# Patient Record
Sex: Female | Born: 1960
Health system: Southern US, Community
[De-identification: ages and names within clinical notes are randomized; demographics above are authoritative.]

## PROBLEM LIST (undated history)

## (undated) DIAGNOSIS — F411 Generalized anxiety disorder: Secondary | ICD-10-CM

## (undated) DIAGNOSIS — R5381 Other malaise: Secondary | ICD-10-CM

## (undated) DIAGNOSIS — E039 Hypothyroidism, unspecified: Secondary | ICD-10-CM

## (undated) DIAGNOSIS — R5383 Other fatigue: Secondary | ICD-10-CM

## (undated) DIAGNOSIS — E0500 Thyrotoxicosis with diffuse goiter without thyrotoxic crisis or storm: Secondary | ICD-10-CM

## (undated) DIAGNOSIS — E0590 Thyrotoxicosis, unspecified without thyrotoxic crisis or storm: Secondary | ICD-10-CM

## (undated) DIAGNOSIS — Z978 Presence of other specified devices: Secondary | ICD-10-CM

## (undated) HISTORY — PX: AUGMENTATION MAMMAPLASTY: SUR837

## (undated) HISTORY — DX: Hypothyroidism, unspecified: E03.9

## (undated) HISTORY — DX: Generalized anxiety disorder: F41.1

## (undated) HISTORY — DX: Presence of other specified devices: Z97.8

## (undated) HISTORY — DX: Thyrotoxicosis with diffuse goiter without thyrotoxic crisis or storm: E05.00

## (undated) HISTORY — DX: Other fatigue: R53.83

## (undated) HISTORY — DX: Other malaise: R53.81

---

## 1997-11-30 ENCOUNTER — Other Ambulatory Visit: Admission: RE | Admit: 1997-11-30 | Discharge: 1997-11-30 | Payer: Self-pay | Admitting: Obstetrics and Gynecology

## 1998-01-29 ENCOUNTER — Other Ambulatory Visit: Admission: RE | Admit: 1998-01-29 | Discharge: 1998-01-29 | Payer: Self-pay | Admitting: Obstetrics and Gynecology

## 1998-04-28 ENCOUNTER — Other Ambulatory Visit: Admission: RE | Admit: 1998-04-28 | Discharge: 1998-04-28 | Payer: Self-pay | Admitting: Gynecology

## 1999-07-11 ENCOUNTER — Other Ambulatory Visit: Admission: RE | Admit: 1999-07-11 | Discharge: 1999-07-11 | Payer: Self-pay | Admitting: Obstetrics and Gynecology

## 2000-03-15 ENCOUNTER — Encounter: Payer: Self-pay | Admitting: Obstetrics and Gynecology

## 2000-03-15 ENCOUNTER — Encounter: Admission: RE | Admit: 2000-03-15 | Discharge: 2000-03-15 | Payer: Self-pay | Admitting: Otolaryngology

## 2000-07-23 ENCOUNTER — Other Ambulatory Visit: Admission: RE | Admit: 2000-07-23 | Discharge: 2000-07-23 | Payer: Self-pay | Admitting: Obstetrics and Gynecology

## 2001-07-28 ENCOUNTER — Other Ambulatory Visit: Admission: RE | Admit: 2001-07-28 | Discharge: 2001-07-28 | Payer: Self-pay | Admitting: Obstetrics and Gynecology

## 2001-11-18 ENCOUNTER — Encounter: Payer: Self-pay | Admitting: Obstetrics and Gynecology

## 2001-11-18 ENCOUNTER — Encounter: Admission: RE | Admit: 2001-11-18 | Discharge: 2001-11-18 | Payer: Self-pay | Admitting: Obstetrics and Gynecology

## 2002-08-28 ENCOUNTER — Other Ambulatory Visit: Admission: RE | Admit: 2002-08-28 | Discharge: 2002-08-28 | Payer: Self-pay | Admitting: Obstetrics and Gynecology

## 2003-01-18 ENCOUNTER — Encounter: Admission: RE | Admit: 2003-01-18 | Discharge: 2003-01-18 | Payer: Self-pay | Admitting: Obstetrics and Gynecology

## 2003-08-31 ENCOUNTER — Other Ambulatory Visit: Admission: RE | Admit: 2003-08-31 | Discharge: 2003-08-31 | Payer: Self-pay | Admitting: Obstetrics and Gynecology

## 2004-09-03 ENCOUNTER — Other Ambulatory Visit: Admission: RE | Admit: 2004-09-03 | Discharge: 2004-09-03 | Payer: Self-pay | Admitting: Obstetrics and Gynecology

## 2004-09-25 ENCOUNTER — Encounter: Admission: RE | Admit: 2004-09-25 | Discharge: 2004-09-25 | Payer: Self-pay | Admitting: Obstetrics and Gynecology

## 2005-03-01 ENCOUNTER — Emergency Department: Payer: Self-pay | Admitting: General Practice

## 2005-09-11 ENCOUNTER — Other Ambulatory Visit: Admission: RE | Admit: 2005-09-11 | Discharge: 2005-09-11 | Payer: Self-pay | Admitting: Obstetrics and Gynecology

## 2005-11-19 LAB — CONVERTED CEMR LAB: Pap Smear: NORMAL

## 2006-06-02 ENCOUNTER — Emergency Department: Payer: Self-pay | Admitting: Emergency Medicine

## 2006-07-02 ENCOUNTER — Encounter: Payer: Self-pay | Admitting: Internal Medicine

## 2006-07-27 ENCOUNTER — Ambulatory Visit: Payer: Self-pay | Admitting: Internal Medicine

## 2006-07-27 DIAGNOSIS — R5381 Other malaise: Secondary | ICD-10-CM

## 2006-07-27 DIAGNOSIS — R079 Chest pain, unspecified: Secondary | ICD-10-CM

## 2006-07-27 DIAGNOSIS — R519 Headache, unspecified: Secondary | ICD-10-CM | POA: Insufficient documentation

## 2006-07-27 DIAGNOSIS — R51 Headache: Secondary | ICD-10-CM

## 2006-07-27 DIAGNOSIS — Z978 Presence of other specified devices: Secondary | ICD-10-CM

## 2006-07-27 DIAGNOSIS — R5383 Other fatigue: Secondary | ICD-10-CM

## 2006-07-27 HISTORY — DX: Other malaise: R53.81

## 2006-07-27 HISTORY — DX: Presence of other specified devices: Z97.8

## 2006-07-27 LAB — CONVERTED CEMR LAB
Bilirubin Urine: NEGATIVE
Blood Glucose, Fingerstick: 87
Blood in Urine, dipstick: NEGATIVE
Glucose, Urine, Semiquant: NEGATIVE
Ketones, urine, test strip: NEGATIVE
Nitrite: NEGATIVE
Protein, U semiquant: NEGATIVE
Specific Gravity, Urine: <1.005
Urobilinogen, UA: NEGATIVE
WBC Urine, dipstick: NEGATIVE
pH: 5

## 2006-07-30 ENCOUNTER — Ambulatory Visit: Payer: Self-pay | Admitting: Cardiology

## 2006-08-02 ENCOUNTER — Encounter (INDEPENDENT_AMBULATORY_CARE_PROVIDER_SITE_OTHER): Payer: Self-pay | Admitting: *Deleted

## 2006-08-02 LAB — CONVERTED CEMR LAB
Albumin: 3.9 g/dL (ref 3.5–5.2)
Alkaline Phosphatase: 43 units/L (ref 39–117)
BUN: 11 mg/dL (ref 6–23)
Creatinine, Ser: 0.8 mg/dL (ref 0.4–1.2)
GFR calc non Af Amer: 83 mL/min
Potassium: 4.4 meq/L (ref 3.5–5.1)
Sed Rate: 8 mm/hr (ref 0–25)
Sodium: 137 meq/L (ref 135–145)
Total Bilirubin: 0.7 mg/dL (ref 0.3–1.2)

## 2006-08-09 ENCOUNTER — Ambulatory Visit: Payer: Self-pay | Admitting: Cardiovascular Disease

## 2006-08-13 ENCOUNTER — Ambulatory Visit: Payer: Self-pay | Admitting: Family Medicine

## 2006-08-13 DIAGNOSIS — F329 Major depressive disorder, single episode, unspecified: Secondary | ICD-10-CM

## 2006-08-17 ENCOUNTER — Ambulatory Visit: Payer: Self-pay

## 2006-09-13 ENCOUNTER — Telehealth (INDEPENDENT_AMBULATORY_CARE_PROVIDER_SITE_OTHER): Payer: Self-pay | Admitting: *Deleted

## 2006-09-17 ENCOUNTER — Ambulatory Visit: Payer: Self-pay | Admitting: Internal Medicine

## 2006-09-17 ENCOUNTER — Other Ambulatory Visit: Admission: RE | Admit: 2006-09-17 | Discharge: 2006-09-17 | Payer: Self-pay | Admitting: Obstetrics and Gynecology

## 2006-09-17 DIAGNOSIS — F411 Generalized anxiety disorder: Secondary | ICD-10-CM | POA: Insufficient documentation

## 2006-09-17 HISTORY — DX: Generalized anxiety disorder: F41.1

## 2006-09-21 LAB — CONVERTED CEMR LAB
Eosinophils Absolute: 0.2 10*3/uL (ref 0.0–0.7)
Eosinophils Relative: 3 % (ref 0–5)
Folate: >20 ng/mL
Glucose, Bld: 87 mg/dL (ref 70–99)
HCT: 40.4 % (ref 36.0–46.0)
Hemoglobin: 13.5 g/dL (ref 12.0–15.0)
Lymphocytes Relative: 33 % (ref 12–46)
Lymphs Abs: 2.4 10*3/uL (ref 0.7–3.3)
MCV: 96.2 fL (ref 78.0–100.0)
Monocytes Absolute: 0.5 10*3/uL (ref 0.2–0.7)
Monocytes Relative: 7 % (ref 3–11)
Platelets: 259 10*3/uL (ref 150–400)
RBC: 4.2 M/uL (ref 3.87–5.11)
WBC: 7.3 10*3/uL (ref 4.0–10.5)

## 2006-10-25 ENCOUNTER — Telehealth (INDEPENDENT_AMBULATORY_CARE_PROVIDER_SITE_OTHER): Payer: Self-pay | Admitting: *Deleted

## 2007-11-25 IMAGING — CT CT HEAD W/O CM
1 series · 16 of 30 positions shown, 20 images · IV contrast (agent unspecified)
Comparison: none

CLINICAL DATA: Frontal headaches on a daily basis.  Fatigue.  Lower extremity weakness.  Dizziness.
 HEAD CT WITHOUT CONTRAST:
TECHNIQUE: Contiguous axial images were obtained from the base of the skull through the vertex according to standard protocol without contrast.

[Series 2: head_seq 4.5 h37s st · axial · 0.43mm/px · z∈[-126,+0]mm · 16 of 32 slices shown, 20 images]
[im 2/32  brain]
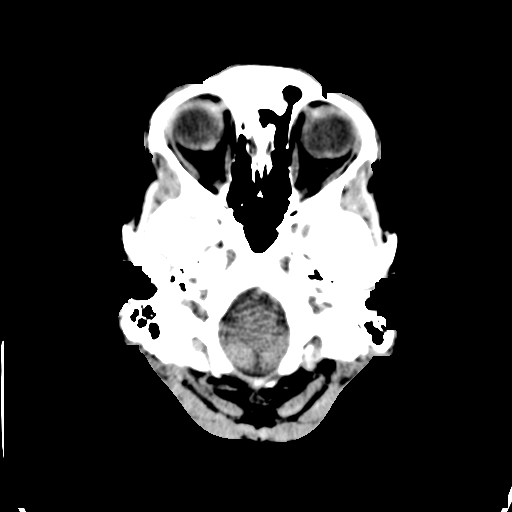
[im 2/32  bone]
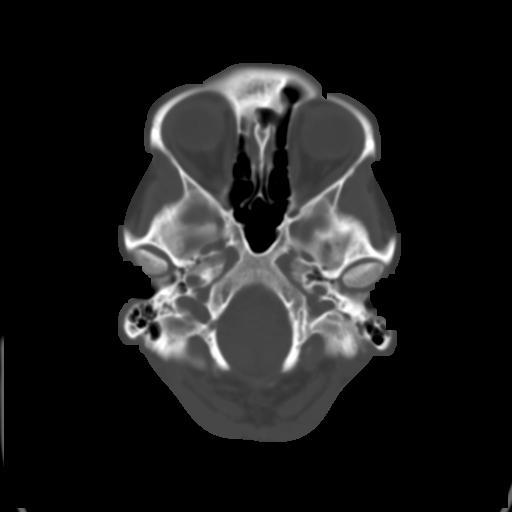
[im 4/32  brain]
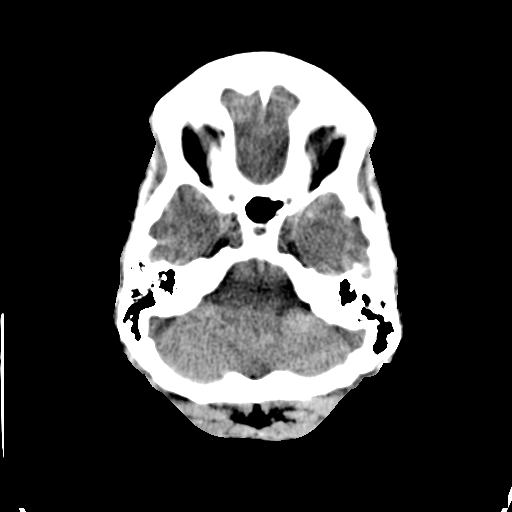
[im 6/32  brain]
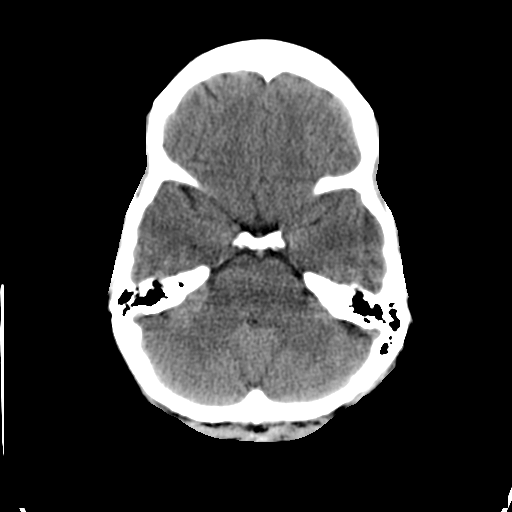
[im 8/32  brain]
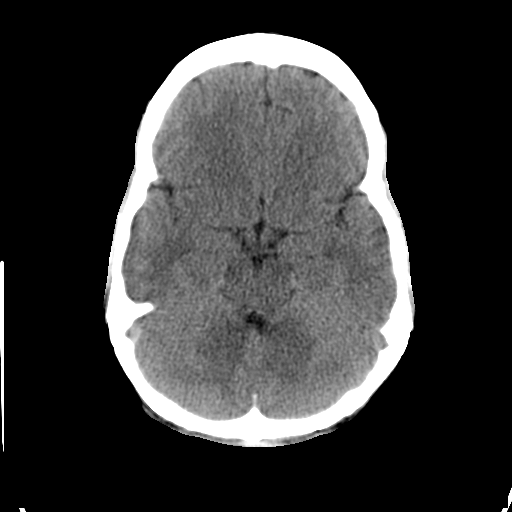
[im 9/32  brain]
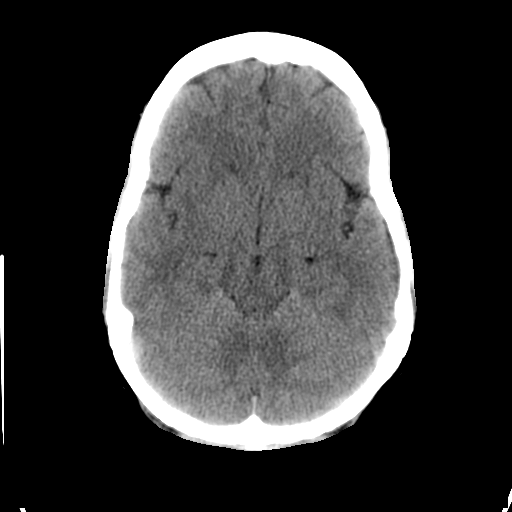
[im 9/32  bone]
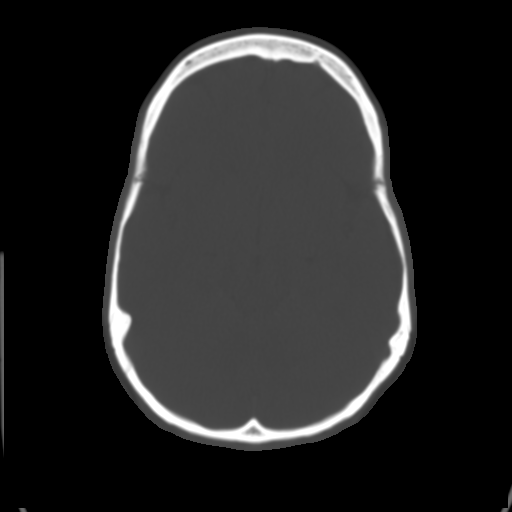
[im 11/32  brain]
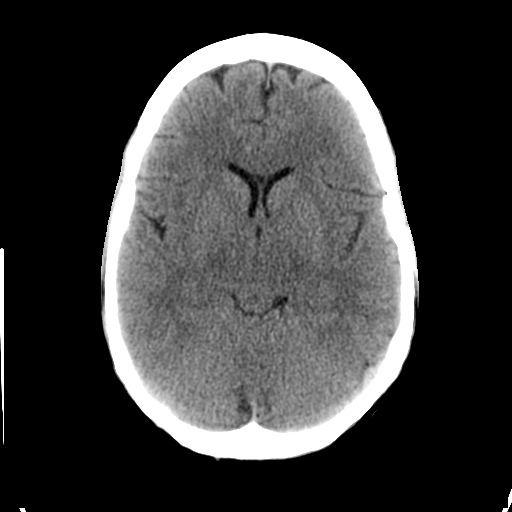
[im 13/32  brain]
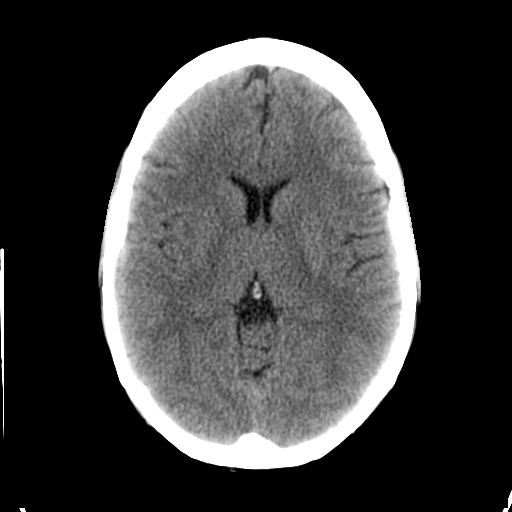
[im 15/32  brain]
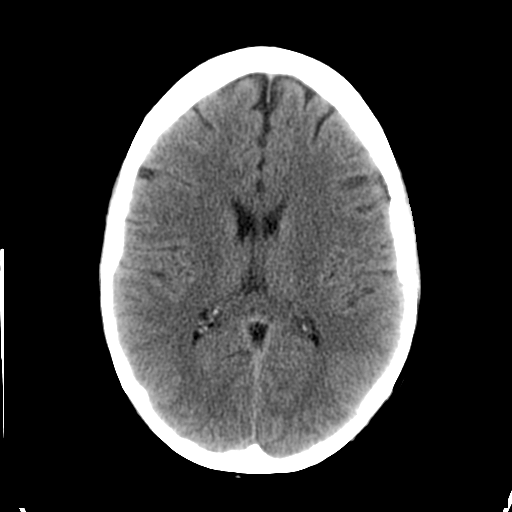
[im 17/32  brain]
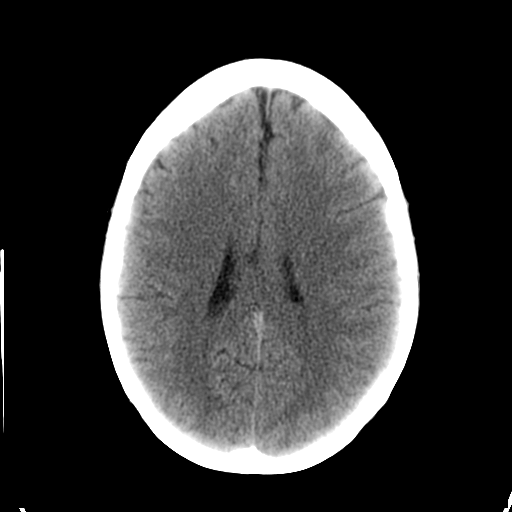
[im 17/32  bone]
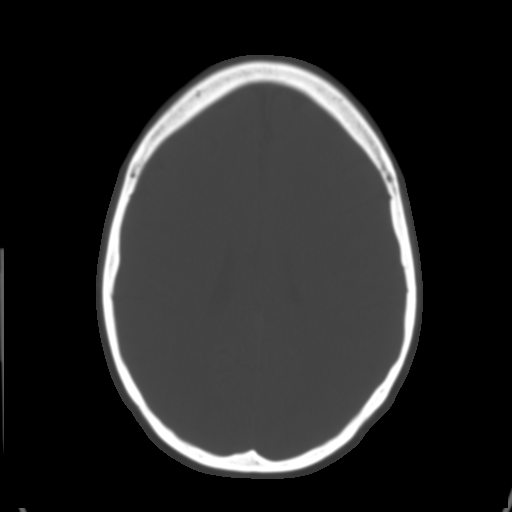
[im 19/32  brain]
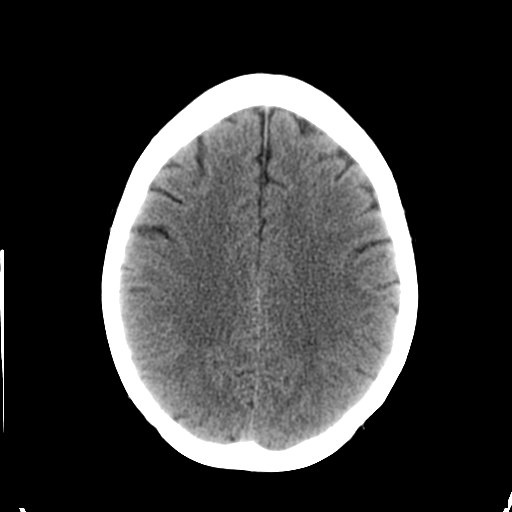
[im 21/32  brain]
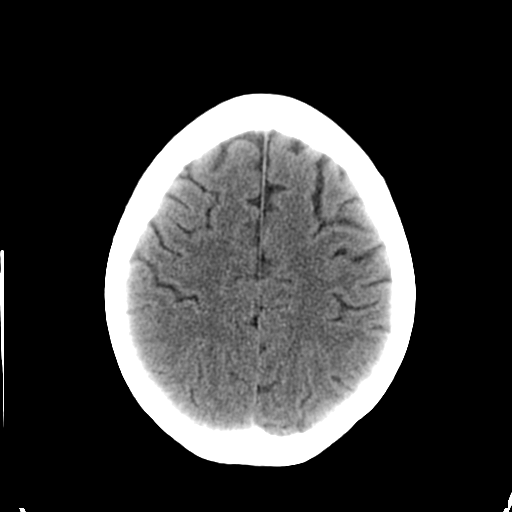
[im 23/32  brain]
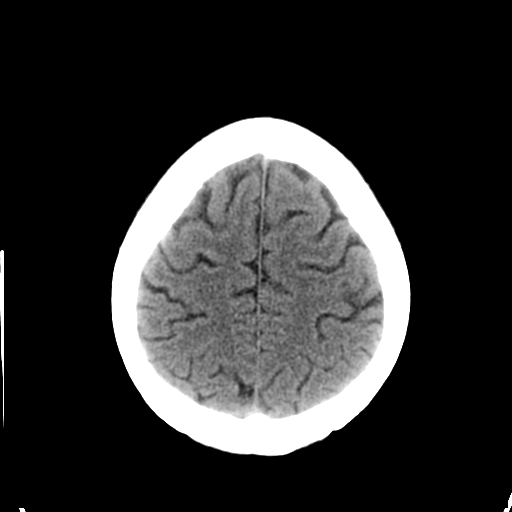
[im 24/32  brain]
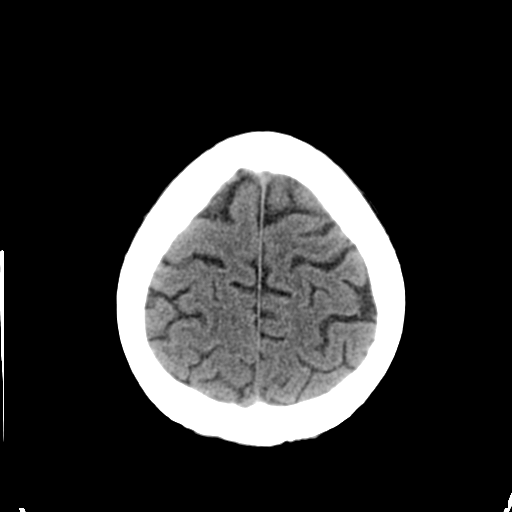
[im 24/32  bone]
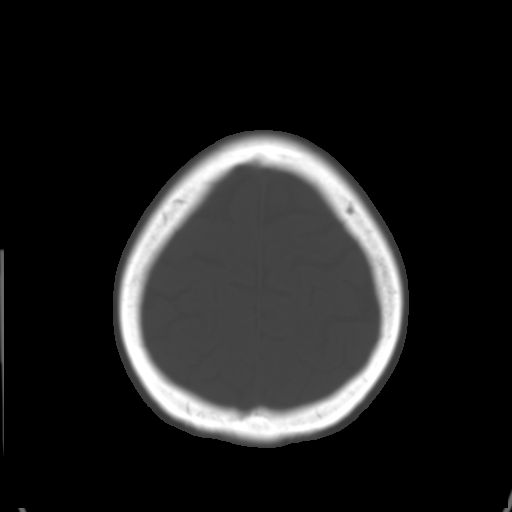
[im 26/32  brain]
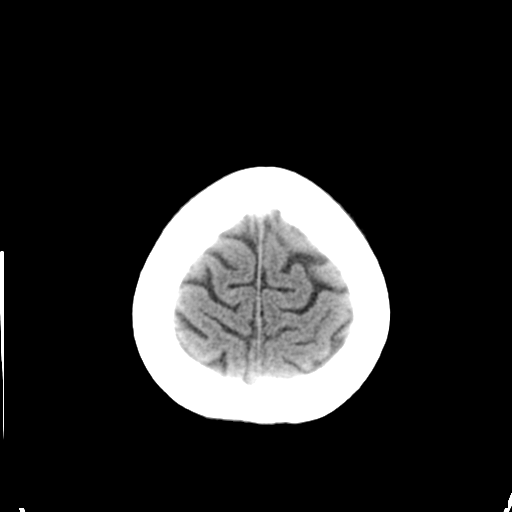
[im 28/32  brain]
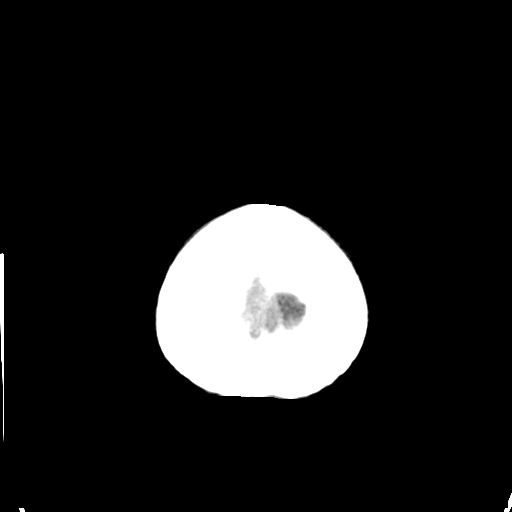
[im 30/32  brain]
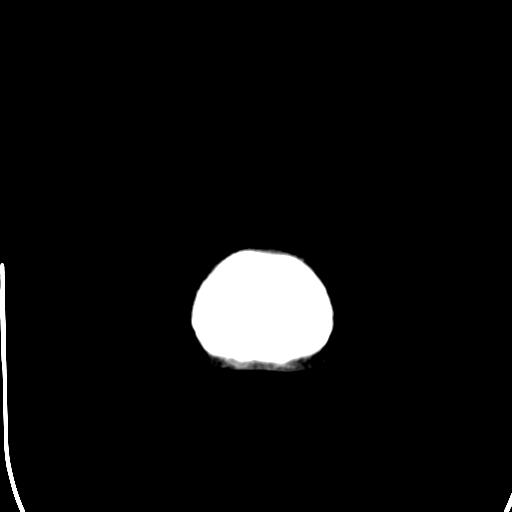

[16 of 30 positions shown; findings below may reference images not displayed]

FINDINGS: Brain has a normal appearance without evidence of atrophy, stroke, mass, hemorrhage, hydrocephalus or extraaxial collection.  Calvarium is normal.  The paranasal sinuses, middle ears and mastoids are clear.
IMPRESSION: Normal examination.

## 2007-12-08 ENCOUNTER — Ambulatory Visit: Payer: Self-pay

## 2007-12-21 ENCOUNTER — Ambulatory Visit: Payer: Self-pay

## 2008-06-21 ENCOUNTER — Ambulatory Visit: Payer: Self-pay | Admitting: Gastroenterology

## 2008-07-12 ENCOUNTER — Ambulatory Visit: Payer: Self-pay | Admitting: Specialist

## 2009-03-18 ENCOUNTER — Ambulatory Visit: Payer: Self-pay

## 2009-03-19 ENCOUNTER — Encounter: Payer: Self-pay | Admitting: Endocrinology

## 2009-03-19 LAB — CONVERTED CEMR LAB: Pap Smear: NORMAL

## 2009-03-19 LAB — HM MAMMOGRAPHY: HM Mammogram: NORMAL

## 2010-02-13 ENCOUNTER — Encounter: Payer: Self-pay | Admitting: Endocrinology

## 2010-02-13 LAB — CONVERTED CEMR LAB
ALT: 26 units/L
AST: 24 units/L
Albumin: 4.5 g/dL
Alkaline Phosphatase: 56 units/L
Basophils Relative: 0 %
CO2: 21 meq/L
Calcium: 9.7 mg/dL
Creatinine, Ser: 0.75 mg/dL
Eosinophils Relative: 0.2 %
Folate: >19.9 ng/mL
Glucose, Bld: 85 mg/dL
Hemoglobin: 13.5 g/dL
Monocytes Relative: 0.7 %
Neutrophils Relative %: 5.2 %
Platelets: 325 10*3/uL
RBC: 4.18 M/uL
Sodium: 139 meq/L
TSH: 0.077 microintl units/mL
Total Bilirubin: 0.1 mg/dL
Vit D, 25-Hydroxy: 51.2 ng/mL
Vitamin B-12: 1130 pg/mL
WBC: 8.4 10*3/uL

## 2010-03-06 ENCOUNTER — Encounter: Payer: Self-pay | Admitting: Endocrinology

## 2010-03-13 ENCOUNTER — Encounter: Payer: Self-pay | Admitting: Endocrinology

## 2010-03-13 ENCOUNTER — Ambulatory Visit (INDEPENDENT_AMBULATORY_CARE_PROVIDER_SITE_OTHER): Payer: 59 | Admitting: Endocrinology

## 2010-03-13 DIAGNOSIS — E0590 Thyrotoxicosis, unspecified without thyrotoxic crisis or storm: Secondary | ICD-10-CM | POA: Insufficient documentation

## 2010-03-13 DIAGNOSIS — E0500 Thyrotoxicosis with diffuse goiter without thyrotoxic crisis or storm: Secondary | ICD-10-CM | POA: Insufficient documentation

## 2010-03-13 HISTORY — DX: Thyrotoxicosis with diffuse goiter without thyrotoxic crisis or storm: E05.00

## 2010-03-14 ENCOUNTER — Other Ambulatory Visit: Payer: Self-pay | Admitting: Endocrinology

## 2010-03-14 DIAGNOSIS — E0590 Thyrotoxicosis, unspecified without thyrotoxic crisis or storm: Secondary | ICD-10-CM

## 2010-03-18 NOTE — Assessment & Plan Note (Signed)
Summary: NEW ENDO CON/ LOW TSH/ [facility]/ DR Tomi Bamberger / [phone] Natale Milch  #   Vital Signs:  Patient profile:   50 year old female Height:      65.75 inches (167.01 cm) Weight:      161.50 pounds (73.41 kg) BMI:     26.36 O2 Sat:      97 % on Room air Temp:     98.6 degrees F (37.00 degrees C) oral Pulse rate:   78 / minute Pulse rhythm:   regular BP sitting:   112 / 76  (left arm) Cuff size:   regular  Vitals Entered By: Brenton Grills CMA Duncan Dull) (March 13, 2010 1:22 PM)  O2 Flow:  Room air CC: New Endo Consult/Low TSH/Dr. Darl Pikes Fuller/aj Is Patient Diabetic? No   Referring Provider:  Tomi Bamberger MD  CC:  New Endo Consult/Low TSH/Dr. Darl Pikes Fuller/aj.  History of Present Illness: pt says she took synthroid x 5 years as a teenager, but was then able to go off it.  she now reports 6-12 months of moderate myalgias, worst at the legs.  she has assoc fatigue.  Current Medications (verified): 1)  Bcp 2)  Mvi 3)  Cal,mg,b12 4)  Celexa 40 Mg Tabs (Citalopram Hydrobromide) .Marland Kitchen.. 1 Tablet By Mouth Once Daily 5)  Wellbutrin Xl 300 Mg Xr24h-Tab (Bupropion Hcl) .Marland Kitchen.. 1 Tablet By Mouth Every Morning  Allergies (verified): 1)  ! Codeine  Past History:  Past Medical History: Last updated: 09/17/2006 Hypothyroidism - was treated when she was [age] yo G1 P1 Anxiety Depression  Family History: Reviewed history from 07/27/2006 and no changes required. Father: deceased  MI age 62yo - prostate Ca, Lung Cancer Mother: living - sarcoma Siblings: twin - good health bro - good health breast Ca - M. family Family History of Alcoholism/Addiction (Mother) Family History High cholesterol (Father) mother takes synthroid  Social History: Reviewed history from 07/27/2006 and no changes required. divorced--engaged to remarry one child  has a full time job Research scientist (physical sciences)), describes herself as "hyper" Patient has never smoked.  Regular Exercise - yes Does Patient Exercise:   yes  Review of Systems       The patient complains of weight gain and headaches.  The patient denies fever.         denies hoarseness, double vision, sob, diarrhea, polyuria, arthralgias, excessive diaphoresis, numbness, and rhinorrhea.   she has palpitations, tremor, easy bruising, and anxiety.  she has reg menses.   Physical Exam  General:  normal appearance.   Head:  head: no deformity eyes: no periorbital swelling, no proptosis external nose and ears are normal mouth: no lesion seen  Neck:  i do not appreciate a goiter Lungs:  Clear to auscultation bilaterally. Normal respiratory effort.  Heart:  Regular rate and rhythm without murmurs or gallops noted. Normal S1,S2.   Abdomen:  abdomen is soft, nontender.  no hepatosplenomegaly.   not distended.  no hernia  Msk:  muscle bulk and strength are grossly normal.  no obvious joint swelling.  gait is normal and steady  Pulses:  dorsalis pedis intact bilat.   Extremities:  no deformity.   no edema  Neurologic:  cn 2-12 grossly intact.   readily moves all 4's.   sensation is intact to touch on the feet there is a slight tremor of the hands Skin:  normal texture and temp.  no rash.  not diaphoretic  Cervical Nodes:  No significant adenopathy.  Psych:  Alert and cooperative; normal  mood and affect; normal attention span and concentration.   Additional Exam:  outside test results are reviewed:  TSH: 0.077   Impression & Recommendations:  Problem # 1:  HYPERTHYROIDISM (ICD-242.90) we discussed the causes, risks, and treatment options of hyperthyroidism  Problem # 2:  GRAVES' DISEASE (ICD-242.00) this is the most likely cause of #1, especially in view of the episode od thyroid dysfunction many years ago.  Problem # 3:  myalgias possibly due to #1  Medications Added to Medication List This Visit: 1)  Celexa 40 Mg Tabs (Citalopram hydrobromide) .Marland Kitchen.. 1 tablet by mouth once daily  Other Orders: i-131 Thyroid uptake and scan  (Capsule & Scan) Nuc Med Diagnostic (thyroid uptake & sca) Consultation Level IV (16109)  Patient Instructions: 1)  check thyroid "scan" (a special but easy and painless type of thyroid x-ray).  you will be called with a day and time for an appointment 2)  then please call [phone] to hear your test results. 3)  based on the result, i hope to request for you a radioactive iodine pill to treat the thyroid. 4)  return here approx 1 month later.     Orders Added: 1)  i-131 Thyroid uptake and scan (Capsule & Scan) Nuc Med Diagnostic [thyroid uptake & sca] 2)  Consultation Level IV [81191]     Preventive Care Screening  Mammogram:    Date:  03/19/2009    Results:  normal   Pap Smear:    Date:  03/19/2009    Results:  normal

## 2010-03-26 ENCOUNTER — Encounter (HOSPITAL_COMMUNITY)
Admission: RE | Admit: 2010-03-26 | Discharge: 2010-03-26 | Disposition: A | Discharge status: Home / Self Care | Payer: 59 | Source: Ambulatory Visit | Attending: Endocrinology | Admitting: Endocrinology

## 2010-03-26 DIAGNOSIS — E0590 Thyrotoxicosis, unspecified without thyrotoxic crisis or storm: Secondary | ICD-10-CM | POA: Insufficient documentation

## 2010-03-27 ENCOUNTER — Encounter (HOSPITAL_COMMUNITY): Payer: Self-pay

## 2010-03-27 ENCOUNTER — Ambulatory Visit (HOSPITAL_COMMUNITY)
Admission: RE | Admit: 2010-03-27 | Discharge: 2010-03-27 | Disposition: A | Discharge status: Home / Self Care | Payer: 59 | Source: Ambulatory Visit | Attending: Endocrinology | Admitting: Endocrinology

## 2010-03-27 DIAGNOSIS — E0590 Thyrotoxicosis, unspecified without thyrotoxic crisis or storm: Secondary | ICD-10-CM | POA: Insufficient documentation

## 2010-03-27 HISTORY — DX: Thyrotoxicosis, unspecified without thyrotoxic crisis or storm: E05.90

## 2010-03-27 MED ORDER — SODIUM IODIDE I 131 CAPSULE: 8.9000 | Freq: Once | INTRAVENOUS | Status: AC | PRN

## 2010-03-27 MED ORDER — SODIUM PERTECHNETATE TC 99M INJECTION
9.5000 | Freq: Once | INTRAVENOUS | Status: AC | PRN
  Administered 2010-03-27: 9.5 via INTRAVENOUS

## 2010-03-27 NOTE — Letter (Signed)
Summary: [facility]   Imported By: Lester Grant 03/17/2010 08:12:55  _____________________________________________________________________  External Attachment:    Type:   Image     Comment:   External Document

## 2010-04-11 ENCOUNTER — Encounter: Payer: Self-pay | Admitting: Endocrinology

## 2010-04-11 ENCOUNTER — Other Ambulatory Visit (INDEPENDENT_AMBULATORY_CARE_PROVIDER_SITE_OTHER): Payer: 59

## 2010-04-11 ENCOUNTER — Ambulatory Visit (INDEPENDENT_AMBULATORY_CARE_PROVIDER_SITE_OTHER): Payer: 59 | Admitting: Endocrinology

## 2010-04-11 VITALS — BP 106/74 | HR 81 | Temp 98.6°F | Ht 65.5 in | Wt 154.6 lb

## 2010-04-11 DIAGNOSIS — E0590 Thyrotoxicosis, unspecified without thyrotoxic crisis or storm: Secondary | ICD-10-CM

## 2010-04-11 LAB — T4, FREE: Free T4: 0.76 ng/dL (ref 0.60–1.60)

## 2010-04-11 LAB — TSH: TSH: 0.65 u[IU]/mL (ref 0.35–5.50)

## 2010-04-11 NOTE — Patient Instructions (Addendum)
blood tests are being ordered for you today.  please call [phone] to hear your test results. Please hold-off on the iodine supplement.   i have entered your pharmacy as [facility] at [address], [place].  Please call if this is incorrect (update: i left message on phone-tree:  Ret 1 month.  No rx is needed now)

## 2010-04-11 NOTE — Progress Notes (Signed)
  Subjective:    Patient ID: Debra Quinn, female    DOB: [birth year], 50 y.o.   MRN: [id number]  HPI Pt was seen here last month for hyperthyroidism.  She reports 6 mos of severe fatigue.  She brings a bottle of an iodine supplement which she is taking.  However, she says she has taken very little of this in the past month.   Past Medical History  Diagnosis Date  . Hyperthyroidism   . Hypothyroidism     was treated when she was [age] yo  . ANXIETY 09/17/2006  . GRAVES' DISEASE 03/13/2010  . FATIGUE, CHRONIC 07/27/2006  . BREAST IMPLANTS, BILATERAL, HX OF 07/27/2006   No past surgical history on file.  reports that she has never smoked. She does not have any smokeless tobacco history on file. Her alcohol and drug histories not on file. family history includes Alcohol abuse in her mother; Cancer in her father and other; and Hyperlipidemia in her father. Allergies  Allergen Reactions  . Codeine     REACTION: hives    Review of Systems Denies significant weight change.     Objective:   Physical Exam Gen:  no distress Neck:  No thyromegaly Skin:  Not diaphoertic     We reviewed thyroid scan, and i gave her a copy of the report.   Lab Results  Component Value Date   TSH 0.65 04/11/2010    Assessment & Plan:  Hyperthyroidism, resolved.  This may indicate resolving subacute thyroiditis.

## 2010-04-16 ENCOUNTER — Telehealth: Payer: Self-pay

## 2010-04-16 NOTE — Telephone Encounter (Signed)
Pt called stating that she is still severely fatigued and per PT message, does not feel that she can wait any longer for treatment. Pt is requesting a call from MD, please advise.

## 2010-04-17 ENCOUNTER — Telehealth: Payer: Self-pay | Admitting: Endocrinology

## 2010-04-17 NOTE — Telephone Encounter (Signed)
Pt states that she missed your call and request a callback on her mobile phone [[phone]].

## 2010-04-18 ENCOUNTER — Other Ambulatory Visit (INDEPENDENT_AMBULATORY_CARE_PROVIDER_SITE_OTHER): Payer: 59

## 2010-04-18 DIAGNOSIS — E0590 Thyrotoxicosis, unspecified without thyrotoxic crisis or storm: Secondary | ICD-10-CM

## 2010-04-18 DIAGNOSIS — E039 Hypothyroidism, unspecified: Secondary | ICD-10-CM

## 2010-04-18 LAB — T4, FREE: Free T4: 0.7 ng/dL (ref 0.60–1.60)

## 2010-04-18 NOTE — Progress Notes (Signed)
  Subjective:    Patient ID: Debra Quinn, female    DOB: Apr 20, 1960, 50 y.o.   MRN: [id number]  HPI i called pt back 04/17/10.  Come in for tsh and free t4 tomorrow.    Review of Systems     Objective:   Physical Exam        Assessment & Plan:

## 2010-04-18 NOTE — Progress Notes (Signed)
Addended by: Romero Belling on: 04/18/2010 09:55 AM   Modules accepted: Orders

## 2010-06-03 NOTE — Assessment & Plan Note (Signed)
[facility]                            CARDIOLOGY OFFICE NOTE   Debra Quinn, Debra Quinn                        MRN:          [id number]  DATE:08/09/2006                            DOB:          [birth year]    HISTORY OF PRESENT ILLNESS:  Debra Quinn is a 50 year old patient  referred by Dr. Drue Novel.  He was asking our opinion in regards to the  patient's chest pain, shortness of breath and fatigue.  The patient  clearly has some anxiety and depression.  She appears very stressed just  to talk to her.  She has been feeling ill over the last 3-6 months,  essentially since the holidays.  Apparently, she felt under a lot of  stress during the holidays and was started on Zoloft.  She asked Dr.  Sylvester Harder, her OB/GYN doctor to prescribe this.  She subsequently  has had some difficulty sleeping and has been taking Ambien.   She describes fluctuating weights. She said she lost weight during the  holidays and now has gained about 20 pounds since that time.  She is  extremely fatigued.  She feels that she has no energy.  She also is a  person who previously has liked to job.  She feels that she cannot get  the strength to exercise, so she gets a significant shortness of breath.  In talking to the patient, she is a nonsmoker.  There is no history of  emphysema or asthma.  There has been no cough or sputum production.  She  does not have any PND or orthopnea or lower extremity edema.  There has  been no previous cardiac history.  No history of mitral valve prolapse  or valve disease.  No history of cardiomyopathy.  She really has not had  a previous history of chest pain.  Her tightness is atypical.  It sounds  stress induced.  It can be nonexertional.  There is no significant  radiation.  She does not appear to have hyperventilation of her  lightheadedness or paresthesias with it.  In general, her sensation of  chest tightness is associated with her shortness of  breath, and  sometimes, they occur separately.   She denies any significant palpitations or flip flops of her heart, and  there has been no syncope.   REVIEW OF SYSTEMS:  Otherwise, remarkable for headaches.  They seem to  be increasing in nature.  There is occasional nausea with them.  Otherwise, negative.   CURRENT MEDICATIONS:  1. Zoloft 100 mg daily.  2. Ambien at night.  3. Birth control pills .   HABITS:  The patient's risk factors for coronary disease include no  tobacco use, no drug use, no alcohol use except for a couple of beers on  Saturday night.  There is a question of elevated fasting glucoses.  The patient is  somewhat concerned about this.  She said her fasting glucose Saturday at  10:00 was 141, and Sunday was 96.   PAST MEDICAL HISTORY:  Breast augmentation surgery.  Otherwise, she has  one child and her  surgical history is negative.   MEDICATIONS:  As listed.   SOCIAL HISTORY:  The patient sells insurance products.  She is divorced.  She likes to jog.  She appears to have some anxiety and depression.  There has been no major psychological problems in the past, but she  describes her life as extremely stressed.   LABORATORY DATA:  I reviewed lab work that was done by Dr. Drue Novel.  Her  fasting sugar at that time was normal.  Her TSH and T4 were normal.  There is question of history of previous hypothyroidism, currently not  on drugs.   FAMILY HISTORY:  Her family history is remarkable for father dying of an  MI at age 70, and he also had prostate cancer.  Mother is living and had  sarcoma.  She has a twin who is in good health and a brother who is in  good health.   ALLERGIES:  CODEINE.   PHYSICAL EXAMINATION:  GENERAL:  A somewhat stressed and agitated white  female, affect does appear anxious.  VITAL SIGNS:  Blood pressure 120/70, pulse 80 and regular.  She is  afebrile.  Weight is 140 pounds.  O2 saturations are 96% on room air.  HEENT:  No  thyromegaly.  No lymphadenopathy.  No JVP elevation.  LUNGS:  Clear with good diaphragmatic motion.  No wheezing.  HEART:  There is an S1, S2 with normal heart sounds.  PMI is normal.  BREAST:  She is status post breast augmentation surgery.  ABDOMEN:  Bowel sounds are positive.  No tenderness.  No  hepatosplenomegaly, hepatojugular reflux.  No AAA.  EXTREMITIES:  Distal pulses are intact with no edema.  NEUROLOGICAL:  Nonfocal.  There is no muscular weakness.  Baseline EKG  is normal.   IMPRESSION:  1. Dyspnea sounds functional, related to anxiety.  No evidence of      cardiopulmonary disease.  Check 2D echocardiogram to assess RV and      LV function.  2. Chest pain.  I do not think the patient needs a stress test at this      time.  As long as her LV function is normal by echocardiogram, she      will continue to follow up with Dr. Drue Novel.  The pain is atypical and      nonexertional.  We certainly could do routine treadmill tests in      the future if needed.  3. Anxiety and depression.  Continue Zoloft and Ambien at night.  I      suspect this is her biggest problem and it may be worthwhile to      refer her to Dr. Dellia Cloud.  4. Question elevated resting sugars.  I think it would be reasonable      for the patient to have hemoglobin A1C and possibly glucose      tolerance test .  5. Question previous thyroid problems.  TSH and T4 were normal.      Follow up with Dr. Drue Novel regarding this and any associations or      fatigue.  6. Weight gain.  Dietary counseling, possibly benefit to chart flow      intake.  I do not think there is a significant problem here.  The      patient does not appear overweight on exam, and I suspect she has      fluctuating weight secondary to her anxiety and depressive order      and  possibly secondary to the addition of Zoloft to her medications.      The patient will be seen on a p.r.n. basis as long as her echo is      normal.     Theron Arista C.  Eden Emms, MD, [facility]  Electronically Signed    PCN/MedQ  DD: 08/09/2006  DT: 08/09/2006  Job #: [id number]

## 2012-03-30 ENCOUNTER — Ambulatory Visit: Payer: Self-pay

## 2012-10-17 ENCOUNTER — Emergency Department (HOSPITAL_COMMUNITY): Admission: EM | Admit: 2012-10-17 | Discharge: 2012-10-17 | Discharge status: Against Medical Advice | Payer: 59

## 2012-10-17 NOTE — ED Notes (Signed)
Pt to triage window shortly after arrival stating she was not going to wait any longer; advised to return at any time

## 2012-11-08 ENCOUNTER — Other Ambulatory Visit: Payer: Self-pay | Admitting: Urology

## 2012-12-08 ENCOUNTER — Ambulatory Visit (HOSPITAL_COMMUNITY): Admission: RE | Admit: 2012-12-08 | Payer: 59 | Source: Ambulatory Visit | Admitting: Urology

## 2012-12-08 ENCOUNTER — Encounter (HOSPITAL_COMMUNITY): Admission: RE | Payer: Self-pay | Source: Ambulatory Visit

## 2012-12-08 SURGERY — CYSTOSCOPY/RETROGRADE/URETEROSCOPY
Anesthesia: Choice | Laterality: Bilateral

## 2013-04-25 ENCOUNTER — Ambulatory Visit: Payer: Self-pay

## 2016-02-25 ENCOUNTER — Other Ambulatory Visit: Payer: Self-pay | Admitting: Family Medicine

## 2016-02-25 DIAGNOSIS — N644 Mastodynia: Secondary | ICD-10-CM

## 2016-02-25 DIAGNOSIS — T8549XA Other mechanical complication of breast prosthesis and implant, initial encounter: Secondary | ICD-10-CM

## 2016-02-27 ENCOUNTER — Ambulatory Visit
Admission: RE | Admit: 2016-02-27 | Discharge: 2016-02-27 | Disposition: A | Discharge status: Home / Self Care | Payer: 59 | Source: Ambulatory Visit | Attending: Family Medicine | Admitting: Family Medicine

## 2016-02-27 DIAGNOSIS — N644 Mastodynia: Secondary | ICD-10-CM

## 2016-02-27 DIAGNOSIS — T8549XA Other mechanical complication of breast prosthesis and implant, initial encounter: Secondary | ICD-10-CM

## 2016-03-13 ENCOUNTER — Other Ambulatory Visit: Payer: Self-pay | Admitting: General Surgery

## 2016-03-13 DIAGNOSIS — N6320 Unspecified lump in the left breast, unspecified quadrant: Secondary | ICD-10-CM

## 2016-03-18 ENCOUNTER — Ambulatory Visit
Admission: RE | Admit: 2016-03-18 | Discharge: 2016-03-18 | Disposition: A | Discharge status: Home / Self Care | Payer: 59 | Source: Ambulatory Visit | Attending: General Surgery | Admitting: General Surgery

## 2016-03-18 DIAGNOSIS — N6320 Unspecified lump in the left breast, unspecified quadrant: Secondary | ICD-10-CM

## 2016-03-18 MED ORDER — GADOBENATE DIMEGLUMINE 529 MG/ML IV SOLN
14.0000 mL | Freq: Once | INTRAVENOUS | Status: AC | PRN
  Administered 2016-03-18: 14 mL via INTRAVENOUS

## 2016-03-24 ENCOUNTER — Other Ambulatory Visit: Payer: Self-pay | Admitting: General Surgery

## 2016-03-24 DIAGNOSIS — N63 Unspecified lump in unspecified breast: Secondary | ICD-10-CM

## 2016-03-25 ENCOUNTER — Ambulatory Visit
Admission: RE | Admit: 2016-03-25 | Discharge: 2016-03-25 | Disposition: A | Discharge status: Home / Self Care | Payer: 59 | Source: Ambulatory Visit | Attending: General Surgery | Admitting: General Surgery

## 2016-03-25 DIAGNOSIS — N63 Unspecified lump in unspecified breast: Secondary | ICD-10-CM

## 2016-03-26 ENCOUNTER — Other Ambulatory Visit: Payer: 59

## 2016-05-25 NOTE — H&P (Signed)
Subjective:     Patient ID: Debra Quinn is a 56 y.o. female.  HPI  Here for follow up discussion prior to planned removal left breast implant, mass excision and capsulectomy.  Presented with left breast pain and palpable indurated area for last 3 months. History of saline submuscular augmentation. MMG with no abnormality in area of concern. US showed left breast 5 o'clock complex collection versus heterogeneous partially cystic mass, immediately adjacent to the implant capsule, with associated free fluid tracking along the dependent portion of the implant spanning 3.2 cm. MRI noted diffuse linear enhancement of the capsule of the left implant. Rim enhancing complex fluid collection in the LOQ noted, contiguous and underneath the external capsule concerning for abscess or potentially necrotic soft tissue mass.  An indeterminate  IM lymph node noted.  Biopsy showed dense inflammation with foreign body giant cell reaction.    [facility] Unable to find any implant information. States "Magellan" breast implants. McGhan implants are now Allergan/Natrelle and they make both saline and silicone. Notes prior to augmentation A cup, following C cup. Current "full C". Happy with this. Wt during this time from 140-165 lb.  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      Objective:   Physical Exam  Cardiovascular: Normal rate, regular rhythm and normal heart sounds.   Pulmonary/Chest: Effort normal and breath sounds normal.   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 We have discussed options remove implant and capsulectomy alone, remove and replace implant on left only, remove and replace bilateral. Her right breast is without contracture and aesthetic- the only benefit I see in operating on this side is that I do not know what implant volume she has and could result in asymmetry breasts if only one implant replaced.  Reviewed contracture most common complication breast implants and still 25% chance developing this again. At this time, in part due to her insurance not approving implant replacement, plan is removal left side alone. She was previously given cosmetic quote for replacement and declines at this time. Discussed she has preop asymmetry nipple position and this will become more pronounced post op. Also despite significant soft tissue pinch breasts will have asymmetry breast volume, shape and loss upper pole fullness on left. Patient indicated understanding. Discussed this does not prevent her from re augmentation in future if desired.   Reviewed drain, OP surgery, significant change to aesthetic appearance breast. Additional risks including but not limited to seroma, hematoma, NAC loss, damage to deeper structures, loss sensation NAC and/or breast skin, wound healing problems, need for additional surgeries, DVT/PE, cardiopulmonary complications.   Rx for Norco given.  Glenna FellowsBrinda Kenwood Rosiak, MD [facility] Plastic & Reconstructive Surgery [phone], pin [phone]

## 2016-06-01 ENCOUNTER — Encounter (HOSPITAL_BASED_OUTPATIENT_CLINIC_OR_DEPARTMENT_OTHER): Payer: Self-pay | Admitting: *Deleted

## 2016-06-05 ENCOUNTER — Ambulatory Visit (HOSPITAL_BASED_OUTPATIENT_CLINIC_OR_DEPARTMENT_OTHER): Payer: 59 | Admitting: Anesthesiology

## 2016-06-05 ENCOUNTER — Ambulatory Visit (HOSPITAL_BASED_OUTPATIENT_CLINIC_OR_DEPARTMENT_OTHER)
Admission: RE | Admit: 2016-06-05 | Discharge: 2016-06-05 | Disposition: A | Discharge status: Home / Self Care | Payer: 59 | Source: Ambulatory Visit | Attending: Plastic Surgery | Admitting: Plastic Surgery

## 2016-06-05 ENCOUNTER — Encounter (HOSPITAL_BASED_OUTPATIENT_CLINIC_OR_DEPARTMENT_OTHER): Payer: Self-pay | Admitting: *Deleted

## 2016-06-05 ENCOUNTER — Encounter (HOSPITAL_BASED_OUTPATIENT_CLINIC_OR_DEPARTMENT_OTHER)
Admission: RE | Disposition: A | Discharge status: Home / Self Care | Payer: Self-pay | Source: Ambulatory Visit | Attending: Plastic Surgery

## 2016-06-05 DIAGNOSIS — T8544XA Capsular contracture of breast implant, initial encounter: Secondary | ICD-10-CM | POA: Insufficient documentation

## 2016-06-05 DIAGNOSIS — Z79899 Other long term (current) drug therapy: Secondary | ICD-10-CM | POA: Insufficient documentation

## 2016-06-05 DIAGNOSIS — F329 Major depressive disorder, single episode, unspecified: Secondary | ICD-10-CM | POA: Diagnosis not present

## 2016-06-05 DIAGNOSIS — Y831 Surgical operation with implant of artificial internal device as the cause of abnormal reaction of the patient, or of later complication, without mention of misadventure at the time of the procedure: Secondary | ICD-10-CM | POA: Insufficient documentation

## 2016-06-05 DIAGNOSIS — N6451 Induration of breast: Secondary | ICD-10-CM | POA: Diagnosis not present

## 2016-06-05 HISTORY — PX: BREAST CAPSULECTOMY WITH IMPLANT EXCHANGE: SHX5592

## 2016-06-05 LAB — POCT HEMOGLOBIN-HEMACUE: Hemoglobin: 14.2 g/dL (ref 12.0–15.0)

## 2016-06-05 SURGERY — CAPSULECTOMY, BREAST, WITH REPLACEMENT OF IMPLANT
Anesthesia: General | Site: Breast | Laterality: Left

## 2016-06-05 MED ORDER — MIDAZOLAM HCL 2 MG/2ML IJ SOLN
INTRAMUSCULAR | Status: AC
  Filled 2016-06-05: qty 2

## 2016-06-05 MED ORDER — ROCURONIUM BROMIDE 10 MG/ML (PF) SYRINGE
PREFILLED_SYRINGE | INTRAVENOUS | Status: AC
  Filled 2016-06-05: qty 5

## 2016-06-05 MED ORDER — ROCURONIUM BROMIDE 100 MG/10ML IV SOLN
INTRAVENOUS | Status: DC | PRN
  Administered 2016-06-05: 10 mg via INTRAVENOUS
  Administered 2016-06-05: 40 mg via INTRAVENOUS

## 2016-06-05 MED ORDER — HYDROMORPHONE HCL 1 MG/ML IJ SOLN
INTRAMUSCULAR | Status: AC
  Filled 2016-06-05: qty 1

## 2016-06-05 MED ORDER — CELECOXIB 200 MG PO CAPS
ORAL_CAPSULE | ORAL | Status: AC
  Filled 2016-06-05: qty 2

## 2016-06-05 MED ORDER — MIDAZOLAM HCL 2 MG/2ML IJ SOLN
1.0000 mg | INTRAMUSCULAR | Status: DC | PRN
  Administered 2016-06-05: 2 mg via INTRAVENOUS

## 2016-06-05 MED ORDER — LIDOCAINE 2% (20 MG/ML) 5 ML SYRINGE
INTRAMUSCULAR | Status: AC
  Filled 2016-06-05: qty 5

## 2016-06-05 MED ORDER — CHLORHEXIDINE GLUCONATE CLOTH 2 % EX PADS: 6.0000 | MEDICATED_PAD | Freq: Once | CUTANEOUS | Status: DC

## 2016-06-05 MED ORDER — PROPOFOL 10 MG/ML IV BOLUS
INTRAVENOUS | Status: DC | PRN
  Administered 2016-06-05: 160 mg via INTRAVENOUS

## 2016-06-05 MED ORDER — ACETAMINOPHEN 500 MG PO TABS
1000.0000 mg | ORAL_TABLET | ORAL | Status: AC
  Administered 2016-06-05: 1000 mg via ORAL

## 2016-06-05 MED ORDER — LACTATED RINGERS IV SOLN
INTRAVENOUS | Status: DC
  Administered 2016-06-05 (×2): via INTRAVENOUS

## 2016-06-05 MED ORDER — CELECOXIB 400 MG PO CAPS
400.0000 mg | ORAL_CAPSULE | ORAL | Status: AC
  Administered 2016-06-05: 400 mg via ORAL

## 2016-06-05 MED ORDER — ONDANSETRON HCL 4 MG/2ML IJ SOLN
INTRAMUSCULAR | Status: AC
  Filled 2016-06-05: qty 2

## 2016-06-05 MED ORDER — LIDOCAINE 2% (20 MG/ML) 5 ML SYRINGE
INTRAMUSCULAR | Status: DC | PRN
  Administered 2016-06-05: 50 mg via INTRAVENOUS

## 2016-06-05 MED ORDER — LIDOCAINE HCL (CARDIAC) 20 MG/ML IV SOLN: INTRAVENOUS | Status: DC | PRN

## 2016-06-05 MED ORDER — FENTANYL CITRATE (PF) 100 MCG/2ML IJ SOLN
INTRAMUSCULAR | Status: AC
  Filled 2016-06-05: qty 2

## 2016-06-05 MED ORDER — SCOPOLAMINE 1 MG/3DAYS TD PT72
1.0000 | MEDICATED_PATCH | Freq: Once | TRANSDERMAL | Status: DC | PRN
Start: 2016-06-05 — End: 2016-06-05

## 2016-06-05 MED ORDER — SUGAMMADEX SODIUM 200 MG/2ML IV SOLN
INTRAVENOUS | Status: AC
  Filled 2016-06-05: qty 2

## 2016-06-05 MED ORDER — GLYCOPYRROLATE 0.2 MG/ML IV SOSY
PREFILLED_SYRINGE | INTRAVENOUS | Status: DC | PRN
  Administered 2016-06-05: .2 mg via INTRAVENOUS

## 2016-06-05 MED ORDER — SUGAMMADEX SODIUM 200 MG/2ML IV SOLN
INTRAVENOUS | Status: DC | PRN
  Administered 2016-06-05: 200 mg via INTRAVENOUS

## 2016-06-05 MED ORDER — MEPERIDINE HCL 25 MG/ML IJ SOLN: 6.2500 mg | INTRAMUSCULAR | Status: DC | PRN

## 2016-06-05 MED ORDER — CEFAZOLIN SODIUM-DEXTROSE 2-4 GM/100ML-% IV SOLN
INTRAVENOUS | Status: AC
Start: 2016-06-05 — End: 2016-06-05
  Filled 2016-06-05: qty 100

## 2016-06-05 MED ORDER — BUPIVACAINE HCL (PF) 0.25 % IJ SOLN
INTRAMUSCULAR | Status: DC | PRN
  Administered 2016-06-05: 18 mL

## 2016-06-05 MED ORDER — GABAPENTIN 300 MG PO CAPS
ORAL_CAPSULE | ORAL | Status: AC
  Filled 2016-06-05: qty 1

## 2016-06-05 MED ORDER — OXYCODONE HCL 5 MG/5ML PO SOLN: 5.0000 mg | Freq: Once | ORAL | Status: AC | PRN

## 2016-06-05 MED ORDER — BUPIVACAINE HCL (PF) 0.25 % IJ SOLN
INTRAMUSCULAR | Status: AC
  Filled 2016-06-05: qty 30

## 2016-06-05 MED ORDER — LACTATED RINGERS IV SOLN
INTRAVENOUS | Status: DC
Start: 2016-06-05 — End: 2016-06-05

## 2016-06-05 MED ORDER — GLYCOPYRROLATE 0.2 MG/ML IJ SOLN: INTRAMUSCULAR | Status: DC | PRN

## 2016-06-05 MED ORDER — CEFAZOLIN SODIUM-DEXTROSE 2-4 GM/100ML-% IV SOLN
2.0000 g | INTRAVENOUS | Status: AC
  Administered 2016-06-05: 2 g via INTRAVENOUS

## 2016-06-05 MED ORDER — DEXAMETHASONE SODIUM PHOSPHATE 4 MG/ML IJ SOLN
INTRAMUSCULAR | Status: DC | PRN
  Administered 2016-06-05: 10 mg via INTRAVENOUS

## 2016-06-05 MED ORDER — ONDANSETRON HCL 4 MG/2ML IJ SOLN
INTRAMUSCULAR | Status: DC | PRN
  Administered 2016-06-05: 4 mg via INTRAVENOUS

## 2016-06-05 MED ORDER — OXYCODONE HCL 5 MG PO TABS
ORAL_TABLET | ORAL | Status: AC
  Filled 2016-06-05: qty 1

## 2016-06-05 MED ORDER — GABAPENTIN 300 MG PO CAPS
300.0000 mg | ORAL_CAPSULE | ORAL | Status: AC
  Administered 2016-06-05: 300 mg via ORAL

## 2016-06-05 MED ORDER — ACETAMINOPHEN 500 MG PO TABS
ORAL_TABLET | ORAL | Status: AC
  Filled 2016-06-05: qty 2

## 2016-06-05 MED ORDER — OXYCODONE HCL 5 MG PO TABS
5.0000 mg | ORAL_TABLET | Freq: Once | ORAL | Status: AC | PRN
  Administered 2016-06-05: 5 mg via ORAL

## 2016-06-05 MED ORDER — HYDROMORPHONE HCL 1 MG/ML IJ SOLN
0.2500 mg | INTRAMUSCULAR | Status: DC | PRN
  Administered 2016-06-05 (×2): 0.5 mg via INTRAVENOUS

## 2016-06-05 MED ORDER — DEXAMETHASONE SODIUM PHOSPHATE 10 MG/ML IJ SOLN
INTRAMUSCULAR | Status: AC
  Filled 2016-06-05: qty 1

## 2016-06-05 MED ORDER — FENTANYL CITRATE (PF) 100 MCG/2ML IJ SOLN
50.0000 ug | INTRAMUSCULAR | Status: AC | PRN
  Administered 2016-06-05: 50 ug via INTRAVENOUS
  Administered 2016-06-05: 25 ug via INTRAVENOUS
  Administered 2016-06-05: 100 ug via INTRAVENOUS
  Administered 2016-06-05: 25 ug via INTRAVENOUS

## 2016-06-05 MED ORDER — PROMETHAZINE HCL 25 MG/ML IJ SOLN: 6.2500 mg | INTRAMUSCULAR | Status: DC | PRN

## 2016-06-05 SURGICAL SUPPLY — 65 items
BAG DECANTER FOR FLEXI CONT (MISCELLANEOUS) IMPLANT
BANDAGE ACE 6X5 VEL STRL LF (GAUZE/BANDAGES/DRESSINGS) IMPLANT
BINDER BREAST LRG (GAUZE/BANDAGES/DRESSINGS) ×3 IMPLANT
BINDER BREAST MEDIUM (GAUZE/BANDAGES/DRESSINGS) IMPLANT
BINDER BREAST XLRG (GAUZE/BANDAGES/DRESSINGS) IMPLANT
BINDER BREAST XXLRG (GAUZE/BANDAGES/DRESSINGS) IMPLANT
BLADE SURG 10 STRL SS (BLADE) ×3 IMPLANT
BLADE SURG 15 STRL LF DISP TIS (BLADE) IMPLANT
BLADE SURG 15 STRL SS (BLADE)
BNDG GAUZE ELAST 4 BULKY (GAUZE/BANDAGES/DRESSINGS) IMPLANT
CANISTER SUCT 1200ML W/VALVE (MISCELLANEOUS) ×3 IMPLANT
CHLORAPREP W/TINT 26ML (MISCELLANEOUS) ×3 IMPLANT
COVER BACK TABLE 60X90IN (DRAPES) ×3 IMPLANT
COVER MAYO STAND STRL (DRAPES) ×3 IMPLANT
DECANTER SPIKE VIAL GLASS SM (MISCELLANEOUS) IMPLANT
DRAIN CHANNEL 15F RND FF W/TCR (WOUND CARE) ×3 IMPLANT
DRAPE INCISE IOBAN 66X45 STRL (DRAPES) IMPLANT
DRAPE TOP ARMCOVERS (MISCELLANEOUS) ×2 IMPLANT
DRAPE U-SHAPE 76X120 STRL (DRAPES) ×3 IMPLANT
DRSG PAD ABDOMINAL 8X10 ST (GAUZE/BANDAGES/DRESSINGS) ×6 IMPLANT
ELECT BLADE 4.0 EZ CLEAN MEGAD (MISCELLANEOUS) ×3
ELECT COATED BLADE 2.86 ST (ELECTRODE) ×3 IMPLANT
ELECT REM PT RETURN 9FT ADLT (ELECTROSURGICAL) ×3
ELECTRODE BLDE 4.0 EZ CLN MEGD (MISCELLANEOUS) ×1 IMPLANT
ELECTRODE REM PT RTRN 9FT ADLT (ELECTROSURGICAL) ×1 IMPLANT
EVACUATOR SILICONE 100CC (DRAIN) ×3 IMPLANT
GLOVE BIO SURGEON STRL SZ 6 (GLOVE) ×6 IMPLANT
GLOVE BIO SURGEON STRL SZ 6.5 (GLOVE) IMPLANT
GLOVE BIO SURGEONS STRL SZ 6.5 (GLOVE)
GLOVE BIOGEL PI IND STRL 7.0 (GLOVE) ×2 IMPLANT
GLOVE BIOGEL PI INDICATOR 7.0 (GLOVE) ×4
GLOVE ECLIPSE 6.5 STRL STRAW (GLOVE) ×3 IMPLANT
GOWN STRL REUS W/ TWL LRG LVL3 (GOWN DISPOSABLE) ×2 IMPLANT
GOWN STRL REUS W/TWL LRG LVL3 (GOWN DISPOSABLE) ×4
IV NS 500ML (IV SOLUTION)
IV NS 500ML BAXH (IV SOLUTION) IMPLANT
KIT FILL SYSTEM UNIVERSAL (SET/KITS/TRAYS/PACK) IMPLANT
LIQUID BAND (GAUZE/BANDAGES/DRESSINGS) ×3 IMPLANT
NEEDLE HYPO 25X1 1.5 SAFETY (NEEDLE) IMPLANT
PACK BASIN DAY SURGERY FS (CUSTOM PROCEDURE TRAY) ×3 IMPLANT
PENCIL BUTTON HOLSTER BLD 10FT (ELECTRODE) ×3 IMPLANT
PIN SAFETY STERILE (MISCELLANEOUS) ×3 IMPLANT
SHEET MEDIUM DRAPE 40X70 STRL (DRAPES) ×3 IMPLANT
SLEEVE SCD COMPRESS KNEE MED (MISCELLANEOUS) ×3 IMPLANT
SPONGE LAP 18X18 X RAY DECT (DISPOSABLE) ×6 IMPLANT
STAPLER VISISTAT 35W (STAPLE) ×3 IMPLANT
SUT ETHILON 2 0 FS 18 (SUTURE) ×3 IMPLANT
SUT MNCRL AB 4-0 PS2 18 (SUTURE) ×3 IMPLANT
SUT PDS 3-0 CT2 (SUTURE)
SUT PDS AB 2-0 CT2 27 (SUTURE) IMPLANT
SUT PDS II 3-0 CT2 27 ABS (SUTURE) IMPLANT
SUT VIC AB 3-0 PS1 18 (SUTURE) ×2
SUT VIC AB 3-0 PS1 18XBRD (SUTURE) ×1 IMPLANT
SUT VIC AB 3-0 SH 27 (SUTURE)
SUT VIC AB 3-0 SH 27X BRD (SUTURE) IMPLANT
SUT VICRYL 4-0 PS2 18IN ABS (SUTURE) ×3 IMPLANT
SWAB COLLECTION DEVICE MRSA (MISCELLANEOUS) ×6 IMPLANT
SYR 50ML LL SCALE MARK (SYRINGE) IMPLANT
SYR BULB IRRIGATION 50ML (SYRINGE) ×3 IMPLANT
SYR CONTROL 10ML LL (SYRINGE) ×3 IMPLANT
TOWEL OR 17X24 6PK STRL BLUE (TOWEL DISPOSABLE) ×9 IMPLANT
TUBE CONNECTING 20'X1/4 (TUBING) ×2
TUBE CONNECTING 20X1/4 (TUBING) ×4 IMPLANT
UNDERPAD 30X30 (UNDERPADS AND DIAPERS) ×3 IMPLANT
YANKAUER SUCT BULB TIP NO VENT (SUCTIONS) ×3 IMPLANT

## 2016-06-05 NOTE — Interval H&P Note (Signed)
History and Physical Interval Note:  06/05/2016 10:44 AM  Abbey ChattersLisa W Alig  has presented today for surgery, with the diagnosis of LEFT BRE AST MASS  The various methods of treatment have been discussed with the patient and family. After consideration of risks, benefits and other options for treatment, the patient has consented to  Procedure(s): LEFT BREAST CAPSULECTOMY REMOVAL OF SALINE IMPLANTS (Left) as a surgical intervention .  The patient's history has been reviewed, patient examined, no change in status, stable for surgery.  I have reviewed the patient's chart and labs.  Questions were answered to the patient's satisfaction.     Margeart Allender

## 2016-06-05 NOTE — Transfer of Care (Signed)
Immediate Anesthesia Transfer of Care Note  Patient: Debra ChattersLisa W Quinn  Procedure(s) Performed: Procedure(s): LEFT BREAST CAPSULECTOMY REMOVAL OF SALINE IMPLANTS (Left)  Patient Location: PACU  Anesthesia Type:General  Level of Consciousness: awake, sedated and patient cooperative  Airway & Oxygen Therapy: Patient Spontanous Breathing and Patient connected to face mask oxygen  Post-op Assessment: Report given to RN and Post -op Vital signs reviewed and stable  Post vital signs: Reviewed and stable  Last Vitals:  Vitals:   06/05/16 0944  BP: 111/67  Pulse: (!) 57  Resp: 18  Temp: 36.5 C    Last Pain:  Vitals:   06/05/16 0944  TempSrc: Oral      Patients Stated Pain Goal: 0 (06/05/16 0944)  Complications: No apparent anesthesia complications

## 2016-06-05 NOTE — Anesthesia Procedure Notes (Signed)
Procedure Name: Intubation Date/Time: 06/05/2016 11:43 AM Performed by: Lyndee Leo Pre-anesthesia Checklist: Patient identified, Emergency Drugs available, Suction available and Patient being monitored Patient Re-evaluated:Patient Re-evaluated prior to inductionOxygen Delivery Method: Circle system utilized Preoxygenation: Pre-oxygenation with 100% oxygen Intubation Type: IV induction Ventilation: Mask ventilation without difficulty Laryngoscope Size: Mac and 3 Grade View: Grade I Tube type: Oral Tube size: 7.0 mm Number of attempts: 1 Airway Equipment and Method: Stylet and Oral airway Placement Confirmation: ETT inserted through vocal cords under direct vision,  positive ETCO2 and breath sounds checked- equal and bilateral Secured at: 21 cm Tube secured with: Tape Dental Injury: Teeth and Oropharynx as per pre-operative assessment

## 2016-06-05 NOTE — Progress Notes (Signed)
  Patient stated today in preop she believed I would be placing same saline implant back in breast. I stated specifically that no I will not be reusing her current implant as I suspect implant contamination may be cause of her current fluid and inflammation noted on work up. We made no plans to replace implant today, reviewed this is why she was given cosmetic quote for replacement as replacement was specifically denied by her insurance.  Offered to reshedule surgery if she desires to replace implant cosmetically at same time as capsulectomy. She declined and asked to proceed with removal left implant and capsuletomy.  Glenna FellowsBrinda Kaegan Stigler, MD [facility] Plastic & Reconstructive Surgery [phone], pin [phone]

## 2016-06-05 NOTE — Anesthesia Preprocedure Evaluation (Signed)
Anesthesia Evaluation  Patient identified by MRN, date of birth, ID band Patient awake    Reviewed: Allergy & Precautions, NPO status , Patient's Chart, lab work & pertinent test results  Airway Mallampati: I  TM Distance: >3 FB Neck ROM: Full    Dental  (+) Teeth Intact   Pulmonary neg pulmonary ROS,    breath sounds clear to auscultation       Cardiovascular negative cardio ROS   Rhythm:Regular Rate:Normal     Neuro/Psych  Headaches, PSYCHIATRIC DISORDERS Anxiety Depression    GI/Hepatic negative GI ROS, Neg liver ROS,   Endo/Other    Renal/GU negative Renal ROS  negative genitourinary   Musculoskeletal negative musculoskeletal ROS (+)   Abdominal   Peds negative pediatric ROS (+)  Hematology negative hematology ROS (+)   Anesthesia Other Findings   Reproductive/Obstetrics negative OB ROS                             Anesthesia Physical Anesthesia Plan  ASA: II  Anesthesia Plan: General   Post-op Pain Management:    Induction: Intravenous  Airway Management Planned: LMA  Additional Equipment:   Intra-op Plan:   Post-operative Plan: Extubation in OR  Informed Consent: I have reviewed the patients History and Physical, chart, labs and discussed the procedure including the risks, benefits and alternatives for the proposed anesthesia with the patient or authorized representative who has indicated his/her understanding and acceptance.   Dental advisory given  Plan Discussed with: CRNA  Anesthesia Plan Comments:         Anesthesia Quick Evaluation

## 2016-06-05 NOTE — Op Note (Signed)
Operative Note   DATE OF OPERATION: 5.18.18  LOCATION: [facility]-outpatient  SURGICAL DIVISION: Plastic Surgery  PREOPERATIVE DIAGNOSES:  1. Left breast mass 2. History augmentation mammaplasty 3. Capsular contracture left breast  POSTOPERATIVE DIAGNOSES:  same  PROCEDURE:  1. Removal left breast implant with complete capsulectomy  SURGEON: Debra FellowsBrinda Gillie Fleites MD MBA  ASSISTANT: none  ANESTHESIA:  General.   EBL: 50 ml  COMPLICATIONS: None immediate.   INDICATIONS FOR PROCEDURE:  The patient, Debra Quinn, is a 56 y.o. female born on 1960-06-26, is here for removal left breast implant and capsulectomy. She presented with induration and mass over left breast. Imaging demonstrated complex cystic structure associated with implant capsule. Prior biopsy was benign inflammatory tissue.   FINDINGS: Removed intact smooth round saline implant with stamp reading "275" from subpectoral position. Thick calcified capsule noted without seroma. Faxitron image of specimen confirmed resection included biopsy clip.  DESCRIPTION OF PROCEDURE:  The patient's operative site was marked with the patient in the preoperative area. The patient was taken to the operating room. SCDs were placed and IV antibiotics were given. The patient's operative site was prepped and draped in a sterile fashion. A time out was performed and all information was confirmed to be correct.  Incision made in prior left inframammary fold scar and carried through superficial fascia to pectoralis major muscle and capsule. The implant was noted to be in nearly complete submuscular position.Permanent suture was noted at inframammary fold and attached in lower border pectoralis. This was excised with capsule. Dissection completed around capsule. Capsule incised and cultures obtained, scant serous fluid noted upon entering capsule. Intact implant removed as noted above. The remainder of capsule was excised. Cavity irrigated and  hemostasis ensured. Local anesthetic infilatrated surrounding breast. 15 Fr JP drain placed and secured with 2-0 nylon. Closure completed with 3-0 vicryl in superficial fascia followed by 4-0 vicryl in dermis. 4-0 monocryl subcuticular skin closure completed. Tissue adhesive applied followed by dry dressing and breast binder.   The patient was allowed to wake from anesthesia, extubated and taken to the recovery room in satisfactory condition.   SPECIMENS: left breast capsulectomy, cultures  DRAINS: 15 Fr JP left breast  Debra FellowsBrinda Whitten Andreoni, MD [facility]MBA Plastic & Reconstructive Surgery [phone], pin [phone]

## 2016-06-05 NOTE — Anesthesia Postprocedure Evaluation (Addendum)
Anesthesia Post Note  Patient: Debra ChattersLisa W Benard  Procedure(s) Performed: Procedure(s) (LRB): LEFT BREAST CAPSULECTOMY REMOVAL OF SALINE IMPLANTS (Left)  Patient location during evaluation: PACU Anesthesia Type: General Level of consciousness: awake and alert Pain management: pain level controlled Vital Signs Assessment: post-procedure vital signs reviewed and stable Respiratory status: spontaneous breathing, nonlabored ventilation, respiratory function stable and patient connected to nasal cannula oxygen Cardiovascular status: blood pressure returned to baseline and stable Postop Assessment: no signs of nausea or vomiting Anesthetic complications: no       Last Vitals:  Vitals:   06/05/16 1330 06/05/16 1345  BP: 127/88 (!) 126/93  Pulse: 99 97  Resp: 18 17  Temp:      Last Pain:  Vitals:   06/05/16 1345  TempSrc:   PainSc: 2                  Shelton SilvasKevin D Valoree Agent

## 2016-06-05 NOTE — Anesthesia Preprocedure Evaluation (Addendum)
Anesthesia Evaluation  Patient identified by MRN, date of birth, ID band Patient awake    Reviewed: Allergy & Precautions, NPO status , Patient's Chart, lab work & pertinent test results  Airway Mallampati: I       Dental  (+) Teeth Intact, Dental Advisory Given   Pulmonary neg pulmonary ROS,    Pulmonary exam normal        Cardiovascular negative cardio ROS Normal cardiovascular exam     Neuro/Psych  Headaches, PSYCHIATRIC DISORDERS Anxiety Depression    GI/Hepatic negative GI ROS, Neg liver ROS,   Endo/Other    Renal/GU negative Renal ROS  negative genitourinary   Musculoskeletal negative musculoskeletal ROS (+)   Abdominal   Peds negative pediatric ROS (+)  Hematology negative hematology ROS (+)   Anesthesia Other Findings Day of surgery medications reviewed with the patient.  Reproductive/Obstetrics negative OB ROS                            Anesthesia Physical Anesthesia Plan  ASA: II  Anesthesia Plan: General   Post-op Pain Management:    Induction: Intravenous  Airway Management Planned: LMA  Additional Equipment:   Intra-op Plan:   Post-operative Plan: Extubation in OR  Informed Consent: I have reviewed the patients History and Physical, chart, labs and discussed the procedure including the risks, benefits and alternatives for the proposed anesthesia with the patient or authorized representative who has indicated his/her understanding and acceptance.   Dental advisory given  Plan Discussed with: CRNA  Anesthesia Plan Comments:         Anesthesia Quick Evaluation

## 2016-06-05 NOTE — Discharge Instructions (Signed)

## 2016-06-08 ENCOUNTER — Encounter (HOSPITAL_BASED_OUTPATIENT_CLINIC_OR_DEPARTMENT_OTHER): Payer: Self-pay | Admitting: Plastic Surgery

## 2016-06-10 LAB — AEROBIC/ANAEROBIC CULTURE W GRAM STAIN (SURGICAL/DEEP WOUND): Culture: NO GROWTH

## 2016-06-10 LAB — AEROBIC/ANAEROBIC CULTURE (SURGICAL/DEEP WOUND)

## 2016-06-19 NOTE — Addendum Note (Signed)
Addendum  created 06/19/16 1248 by Ilee Randleman D, MD   Sign clinical note    

## 2017-07-14 IMAGING — MR MR BREAST BILAT WO/W CM
10 of 15 series · 29 of 48 positions shown · IV contrast (14cc multihance)
Comparison: Previous exam(s).

CLINICAL DATA: Area of palpable concern in shape deformity of left
breast, in a patient with bilateral subpectoral saline implants
placed in 9111.

LABS:  None.
EXAM:
BILATERAL BREAST MRI WITH AND WITHOUT CONTRAST
TECHNIQUE: Multiplanar, multisequence MR images of both breasts were obtained
prior to and following the intravenous administration of 14 ml of
MultiHance.

[Series 8: T2 fat-sat · axial · 3.0mm · 0.56mm/px · 1 of 45 slices shown]
[im 1/45]
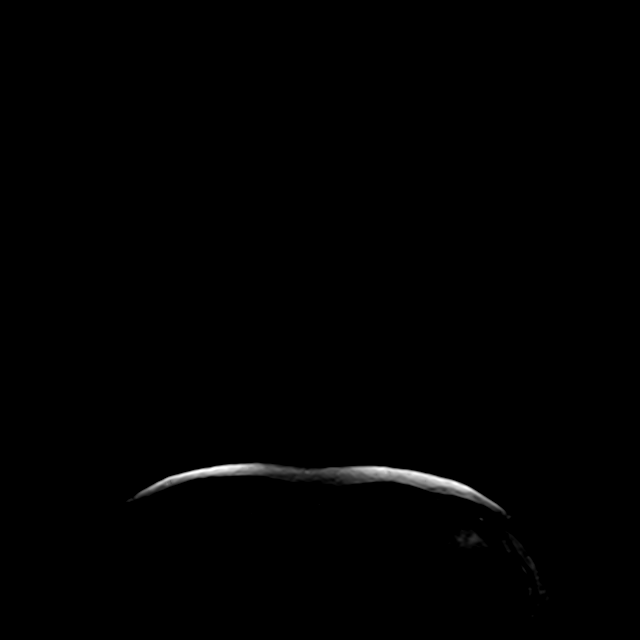

[Series 9: t2_tirm_tra ipat (a-p) · axial · 3.0mm · 0.70mm/px · 1 of 55 slices shown]
[im 1/55]
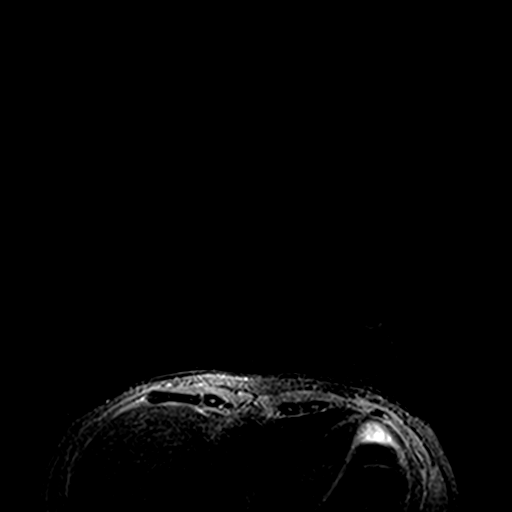

[Series 10: fl3d pre-cm no · axial · non-contrast · 1.2mm · 0.94mm/px · z∈[-56,+116]mm · 4 of 144 slices shown]
[im 1/144]
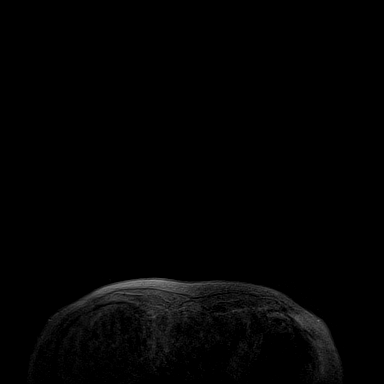
[im 48/144]
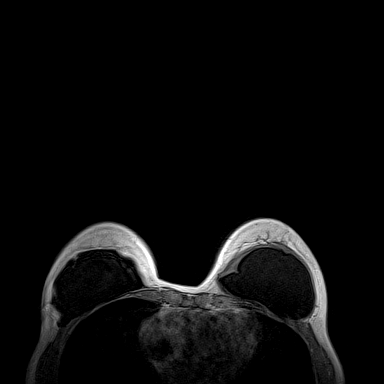
[im 96/144]
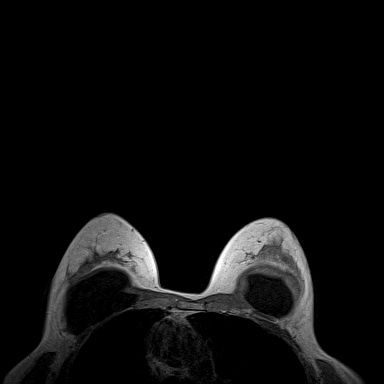
[im 144/144]
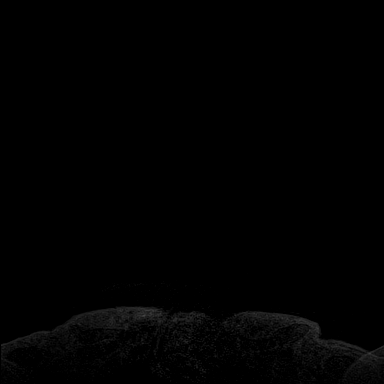

[Series 11: fl3d pre-cm · axial · non-contrast · 1.2mm · 0.94mm/px · z∈[-56,+116]mm · 4 of 144 slices shown]
[im 1/144]
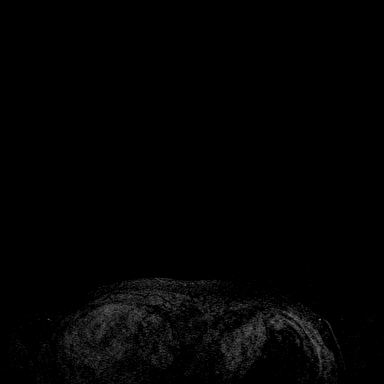
[im 48/144]
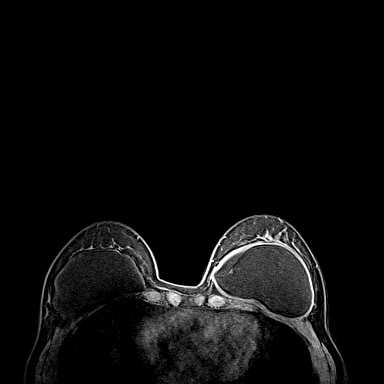
[im 96/144]
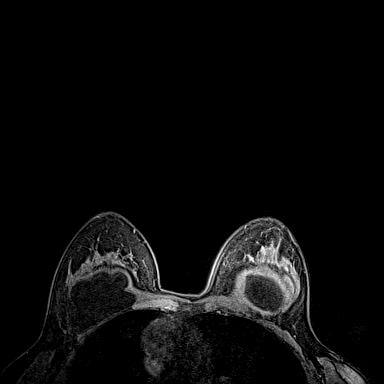
[im 144/144]
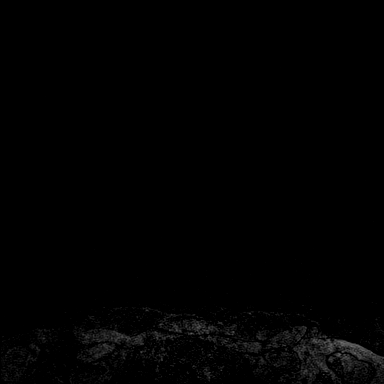

[Series 12: fl3d post immediate · axial · 1.2mm · 0.94mm/px · z∈[-56,+116]mm · 4 of 144 slices shown (1 of 3)]
[im 1/144]
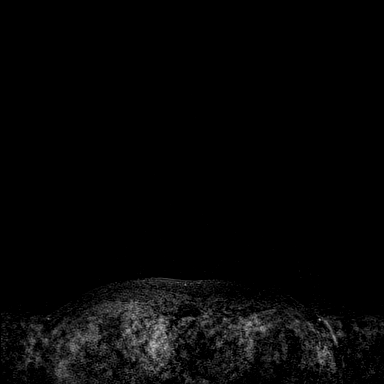
[im 48/144]
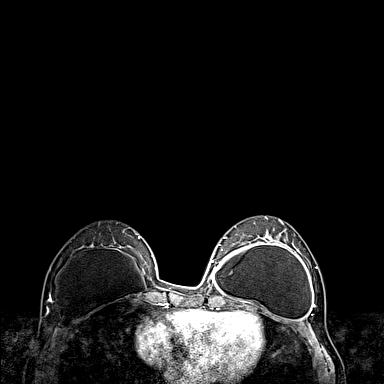
[im 96/144]
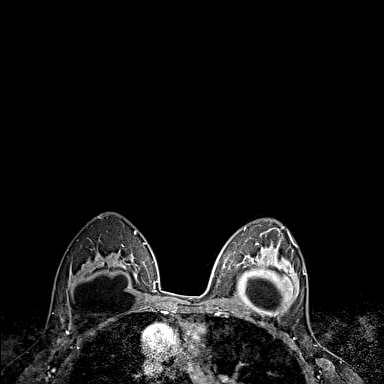
[im 144/144]
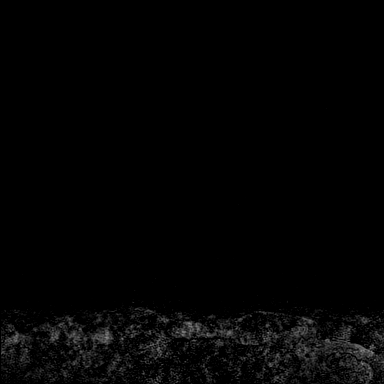

[Series 13: fl3d post immediate · axial · 1.2mm · 0.94mm/px · z∈[-56,+116]mm · 4 of 144 slices shown (2 of 3)]
[im 1/144]
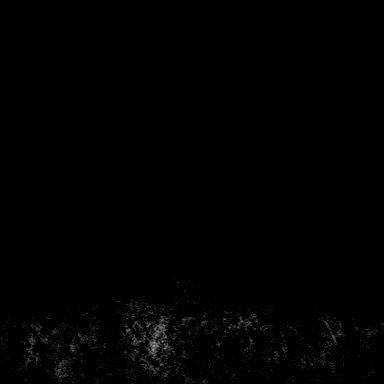
[im 48/144]
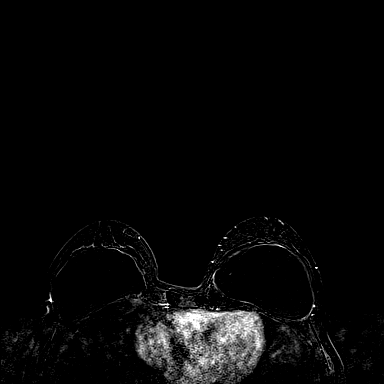
[im 96/144]
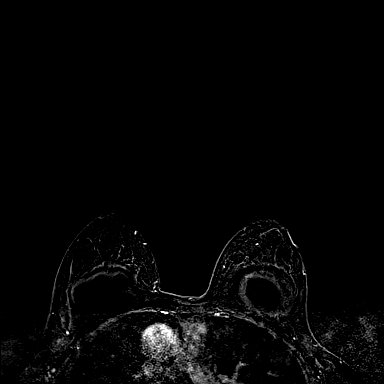
[im 144/144]
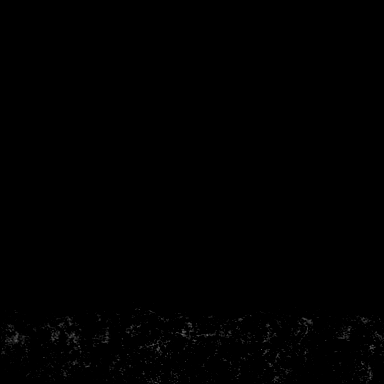

[Series 14: fl3d post immediate · axial · 172.8mm · 0.94mm/px · 1 of 1 slices shown (3 of 3)]
[im 1/1]
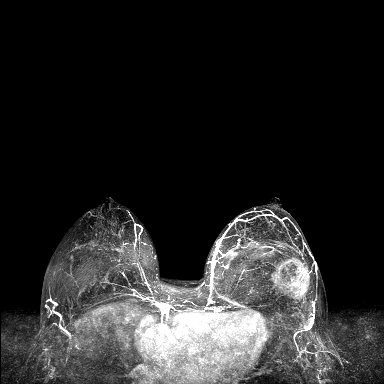

[Series 15: fl3d post 3min · axial · 1.2mm · 0.94mm/px · z∈[-56,+116]mm · 4 of 144 slices shown]
[im 1/144]
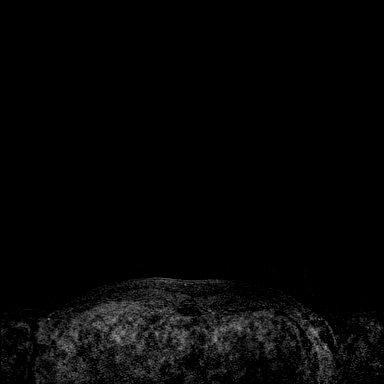
[im 48/144]
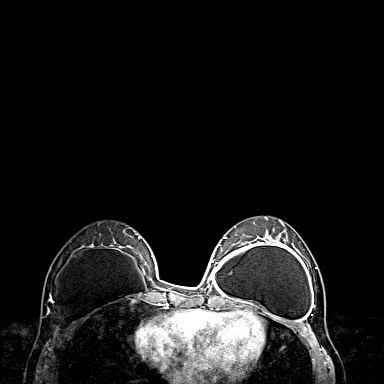
[im 96/144]
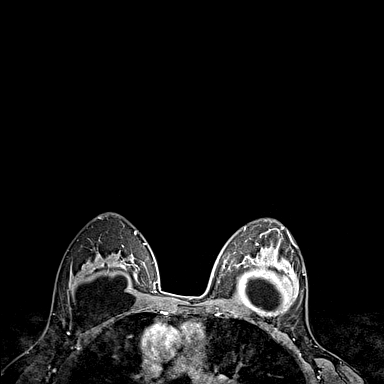
[im 144/144]
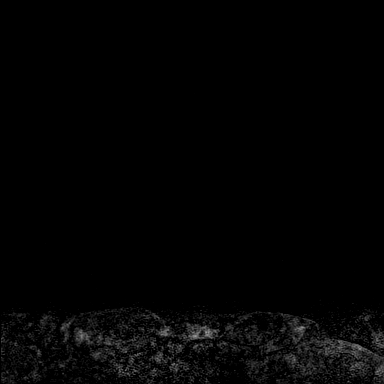

[Series 16: fl3d post 3min_sub · axial · 1.2mm · 0.94mm/px · z∈[-56,+116]mm · 5 of 144 slices shown]
[im 1/144]
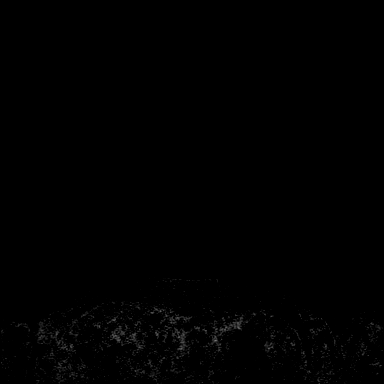
[im 36/144]
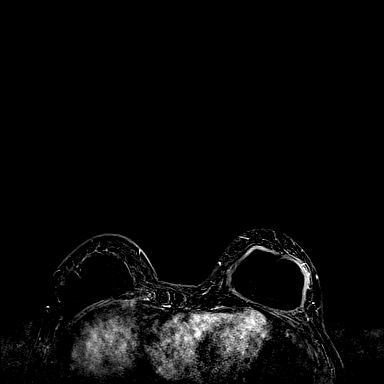
[im 72/144]
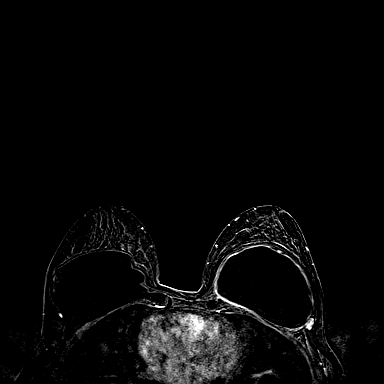
[im 108/144]
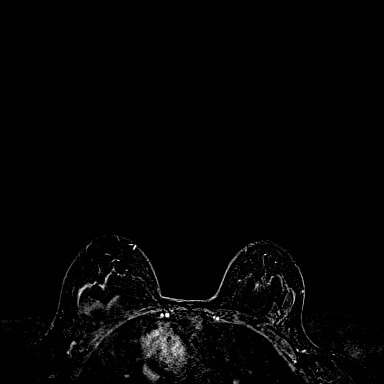
[im 144/144]
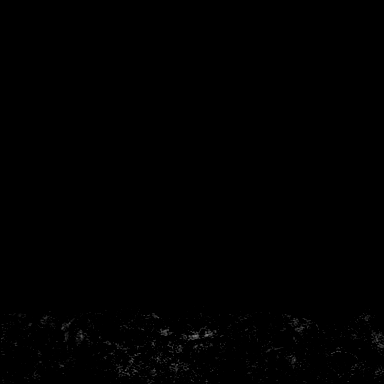

[Series 17: fl3d post 3min_sub_mip_tra · axial · 172.8mm · 0.94mm/px · 1 of 1 slices shown]
[im 1/1]
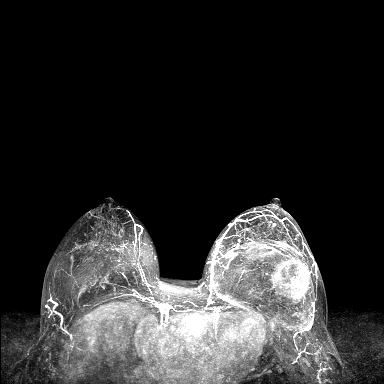

[29 of 48 positions shown; findings below may reference images not displayed]

THREE-DIMENSIONAL MR IMAGE RENDERING ON INDEPENDENT WORKSTATION:

Three-dimensional MR images were rendered by post-processing of the
original MR data on an independent workstation. The
three-dimensional MR images were interpreted, and findings are
reported in the following complete MRI report for this study. Three
dimensional images were evaluated at the independent [employer]
workstation
FINDINGS: Breast composition: c. Heterogeneous fibroglandular tissue.

Background parenchymal enhancement: Moderate.

Right breast: No mass or abnormal enhancement. There is an intact
subpectoral saline implant.

Left breast: There is an intact subpectoral saline implant which
demonstrates prominent radial folds, likely due to capsular
contraction. There is diffuse linear enhancement along the external
capsule. Contiguous with and underneath the external capsule in the
left breast lower outer quadrant, there is a thick irregular
progressively rim enhancing complex fluid collection measuring
by 3.1 by 1.4 cm. There is an associated mild edema and enhancement
of the overlying breast parenchyma. There is also a mild overlying
skin thickening and enhancement. This finding likely corresponds to
the palpable abnormality described by the patient as it causes
contour deformity of the breast. In the left breast upper outer
quadrant, posterior depth, there is an intramammary lymph node
measuring 1.4 cm in long-axis, which demonstrates mild symmetric
cortical thickening.

Lymph nodes: No abnormal appearing axillary lymph nodes.

Ancillary findings:  None.
IMPRESSION: Diffuse linear enhancement of the external capsule of the left
subpectoral breast implant.

Thick irregular rim enhancing complex fluid collection in the lower
outer quadrant, contiguous and underneath the external capsule of
the left breast implant with associated overlying breast parenchymal
and skin mild edema and enhancement. This may represent a phlegmon
collection/breast abscess or potentially necrotic soft tissue mass.

Indeterminate by MR criteria left breast upper outer quadrant,
posterior depth, intramammary lymph node.

No MRI evidence of malignancy in the right breast.

RECOMMENDATION:
Ultrasound-guided core needle biopsy of the left breast lower outer
quadrant rim enhancing fluid collection. Aspiration of any fluid
component may be considered at the time of the biopsy as this may be
therapeutic.

Second-look ultrasound of the left breast upper outer quadrant to
further characterize the intramammary lymph node at the time of the
core needle biopsy. If the lymph node is found to demonstrate
suspicious sonographic features, core needle biopsy may also be
considered.

BI-RADS CATEGORY  4: Suspicious.

## 2017-10-22 ENCOUNTER — Encounter: Payer: Self-pay | Admitting: Emergency Medicine

## 2017-10-22 ENCOUNTER — Emergency Department
Admission: EM | Admit: 2017-10-22 | Discharge: 2017-10-22 | Disposition: A | Discharge status: Home / Self Care | Payer: 59 | Attending: Emergency Medicine | Admitting: Emergency Medicine

## 2017-10-22 DIAGNOSIS — Y9389 Activity, other specified: Secondary | ICD-10-CM | POA: Insufficient documentation

## 2017-10-22 DIAGNOSIS — S61214A Laceration without foreign body of right ring finger without damage to nail, initial encounter: Secondary | ICD-10-CM | POA: Insufficient documentation

## 2017-10-22 DIAGNOSIS — S6991XA Unspecified injury of right wrist, hand and finger(s), initial encounter: Secondary | ICD-10-CM | POA: Diagnosis present

## 2017-10-22 DIAGNOSIS — E039 Hypothyroidism, unspecified: Secondary | ICD-10-CM | POA: Diagnosis not present

## 2017-10-22 DIAGNOSIS — Y92003 Bedroom of unspecified non-institutional (private) residence as the place of occurrence of the external cause: Secondary | ICD-10-CM | POA: Insufficient documentation

## 2017-10-22 DIAGNOSIS — W260XXA Contact with knife, initial encounter: Secondary | ICD-10-CM | POA: Diagnosis not present

## 2017-10-22 DIAGNOSIS — Y999 Unspecified external cause status: Secondary | ICD-10-CM | POA: Insufficient documentation

## 2017-10-22 DIAGNOSIS — Z79899 Other long term (current) drug therapy: Secondary | ICD-10-CM | POA: Insufficient documentation

## 2017-10-22 MED ORDER — OXYCODONE-ACETAMINOPHEN 5-325 MG PO TABS
1.0000 | ORAL_TABLET | Freq: Once | ORAL | Status: AC
Start: 2017-10-22 — End: 2017-10-22
  Administered 2017-10-22: 1 via ORAL
  Filled 2017-10-22: qty 1

## 2017-10-22 MED ORDER — OXYCODONE-ACETAMINOPHEN 5-325 MG PO TABS: 1.0000 | ORAL_TABLET | Freq: Four times a day (QID) | ORAL | 0 refills | Status: DC | PRN

## 2017-10-22 MED ORDER — ONDANSETRON 4 MG PO TBDP
4.0000 mg | ORAL_TABLET | Freq: Once | ORAL | Status: DC
  Filled 2017-10-22: qty 1

## 2017-10-22 MED ORDER — LIDOCAINE HCL (PF) 1 % IJ SOLN
10.0000 mL | Freq: Once | INTRAMUSCULAR | Status: AC
  Administered 2017-10-22: 10 mL
  Filled 2017-10-22: qty 10

## 2017-10-22 MED ORDER — LIDOCAINE HCL (PF) 1 % IJ SOLN
INTRAMUSCULAR | Status: AC
  Filled 2017-10-22: qty 5

## 2017-10-22 MED ORDER — LIDOCAINE HCL (PF) 1 % IJ SOLN
5.0000 mL | Freq: Once | INTRAMUSCULAR | Status: DC
  Filled 2017-10-22: qty 5

## 2017-10-22 NOTE — ED Provider Notes (Signed)
[facility] Emergency Department Provider Note  ____________________________________________   First MD Initiated Contact with Patient 10/22/17 1152     (approximate)  I have reviewed the triage vital signs and the nursing notes.   HISTORY  Chief Complaint Laceration   HPI Debra Quinn is a 57 y.o. female presents to the ED after being sent from [facility] to be evaluated for laceration to her right index finger.  Patient states that [facility] was unable to get her bleeding under control.  Patient states that she had her father's knife in her nightstand to use as protection if someone broke into her house.  She reached inside the door when she cut her finger.  She rates her pain as 6 out of 10.  Past Medical History:  Diagnosis Date  . ANXIETY 09/17/2006  . BREAST IMPLANTS, BILATERAL, HX OF 07/27/2006  . FATIGUE, CHRONIC 07/27/2006  . GRAVES' DISEASE 03/13/2010  . Hyperthyroidism   . Hypothyroidism    was treated when she was 47-26 yo    Patient Active Problem List   Diagnosis Date Noted  . GRAVES' DISEASE 03/13/2010  . HYPERTHYROIDISM 03/13/2010  . ANXIETY 09/17/2006  . Depressive disorder, not elsewhere classified 08/13/2006  . FATIGUE, CHRONIC 07/27/2006  . SYMPTOM, HEADACHE 07/27/2006  . CHEST PAIN 07/27/2006  . BREAST IMPLANTS, BILATERAL, HX OF 07/27/2006    Past Surgical History:  Procedure Laterality Date  . AUGMENTATION MAMMAPLASTY Bilateral   . BREAST CAPSULECTOMY WITH IMPLANT EXCHANGE Left 06/05/2016   Procedure: LEFT BREAST CAPSULECTOMY REMOVAL OF SALINE IMPLANTS;  Surgeon: Glenna Fellows, MD;  Location: [facility];  Service: Plastics;  Laterality: Left;    Prior to Admission medications   Medication Sig Start Date End Date Taking? Authorizing Provider  buPROPion (WELLBUTRIN XL) 300 MG 24 hr tablet Take 300 mg by mouth every morning.      [provider]  citalopram (CELEXA) 40 MG tablet Take 40  mg by mouth daily.      [provider]  NON FORMULARY CAL, MG, B12     [provider]  NON FORMULARY MVI     [provider]  Omega-3 Fatty Acids (FISH OIL PO) Take by mouth.    [provider]  oxyCODONE-acetaminophen (PERCOCET) 5-325 MG tablet Take 1 tablet by mouth every 6 (six) hours as needed for severe pain. 10/22/17   Tommi Rumps, PA-C    Allergies Codeine  Family History  Problem Relation Age of Onset  . Alcohol abuse Mother   . Cancer Father        Prostate Cancer, Lung Cancer  . Hyperlipidemia Father   . Cancer Other        Breast Cancer    Social History Social History   Tobacco Use  . Smoking status: Never Smoker  . Smokeless tobacco: Never Used  Substance Use Topics  . Alcohol use: No  . Drug use: No    Review of Systems Constitutional: No fever/chills Cardiovascular: Denies chest pain. Respiratory: Denies shortness of breath. Musculoskeletal: Positive for right index finger pain. Skin: Positive for laceration. Neurological: Negative for  focal weakness or numbness. ___________________________________________   PHYSICAL EXAM:  VITAL SIGNS: ED Triage Vitals  Enc Vitals Group     BP 10/22/17 1150 (!) 149/90     Pulse --      Resp 10/22/17 1150 18     Temp 10/22/17 1150 98.5 F (36.9 C)     Temp Source  10/22/17 1150 Oral     SpO2 10/22/17 1150 99 %     Weight 10/22/17 1145 150 lb (68 kg)     Height 10/22/17 1145 5\' 7"  (1.702 m)     Head Circumference --      Peak Flow --      Pain Score 10/22/17 1145 6     Pain Loc --      Pain Edu? --      Excl. in GC? --    Constitutional: Alert and oriented. Well appearing and in no acute distress. Eyes: Conjunctivae are normal.  Head: Atraumatic. Neck: No stridor.   Cardiovascular: Normal rate, regular rhythm. Grossly normal heart sounds.  Good peripheral circulation. Respiratory: Normal respiratory effort.  No retractions. Lungs CTAB. Musculoskeletal:  Examination of the right index finger there is a flap type laceration on the volar surface measuring approximately 2.5 cm.  Patient is able to flex and extend her finger without any difficulty.  Motor sensory function intact.  There is minimal bleeding at this time.  No injury to the nail and no foreign body is noted. Neurologic:  Normal speech and language. No gross focal neurologic deficits are appreciated.  Skin:  Skin is warm, dry.  Laceration as noted above. Psychiatric: Mood and affect are normal. Speech and behavior are normal.  ____________________________________________   LABS (all labs ordered are listed, but only abnormal results are displayed)  Labs Reviewed - No data to display  PROCEDURES  Procedure(s) performed:   Marland KitchenMarland KitchenLaceration Repair Date/Time: 10/22/2017 1:10 PM Performed by: Tommi Rumps, PA-C Authorized by: Tommi Rumps, PA-C   Consent:    Consent obtained:  Verbal   Consent given by:  Patient   Risks discussed:  Pain, poor wound healing and poor cosmetic result Anesthesia (see MAR for exact dosages):    Anesthesia method:  Nerve block   Block needle gauge:  25 G   Block anesthetic:  Lidocaine 1% w/o epi   Block technique:  Digital   Block injection procedure:  Anatomic landmarks identified, introduced needle and negative aspiration for blood   Block outcome:  Anesthesia achieved Laceration details:    Location:  Finger   Finger location:  R ring finger   Length (cm):  2.5 Repair type:    Repair type:  Simple Pre-procedure details:    Preparation:  Patient was prepped and draped in usual sterile fashion Exploration:    Hemostasis achieved with:  Direct pressure   Contaminated: no   Treatment:    Area cleansed with:  Saline   Amount of cleaning:  Extensive   Irrigation solution:  Sterile saline   Irrigation volume:  150 mL's   Irrigation method:  Pressure wash and syringe   Visualized foreign bodies/material removed: no   Skin repair:     Repair method:  Sutures   Suture size:  5-0   Suture material:  Nylon   Suture technique:  Simple interrupted   Number of sutures:  10 Approximation:    Approximation:  Close Post-procedure details:    Dressing:  Non-adherent dressing   Patient tolerance of procedure:  Tolerated well, no immediate complications Comments:     Flap-like laceration on the volar aspect of the right fifth finger distal aspect.    Critical Care performed: No  ____________________________________________   INITIAL IMPRESSION / ASSESSMENT AND PLAN / ED COURSE  As part of my medical decision making, I reviewed the following data within the electronic MEDICAL RECORD NUMBER Notes from  prior ED visits and [place] Controlled Substance Database   Patient presents to the ED after being sent over by [facility] for evaluation of a laceration to her right index finger.  Patient was cut with a knife that she keeps in her nightstand for protection.  Tetanus was up-to-date.  Digital block was performed and patient was given Percocet for pain.  Patient tolerated procedure well.  She is aware that she will need to watch the area for any signs of infection and to clean the area daily with mild soap and water.  Currently she still plans to go to [facility] this evening.  Patient given prescription for Percocet if needed for pain.  She is aware that she cannot drive or drink alcohol with this medication.  She is to follow-up for suture removal at an urgent care or return to the emergency department in 10 to 12 days.  She is to be seen sooner if there is any question of infection.  ____________________________________________   FINAL CLINICAL IMPRESSION(S) / ED DIAGNOSES  Final diagnoses:  Laceration of right ring finger without foreign body without damage to nail, initial encounter     ED Discharge Orders         Ordered    oxyCODONE-acetaminophen (PERCOCET) 5-325 MG tablet  Every 6 hours PRN     10/22/17 1304             Note:  This document was prepared using Dragon voice recognition software and may include unintentional dictation errors.    Tommi Rumps, PA-C 10/22/17 1523    Governor Rooks, MD 10/23/17 [phone]

## 2017-10-22 NOTE — ED Triage Notes (Signed)
Pt reports cut her right ring finger on a knife in her drawer this am. Pt reports went to [facility] for stitches but they could not get it to stop bleeding.

## 2017-10-22 NOTE — ED Notes (Signed)
See triage note     States she reached into drawer and cut her finger on knife  Laceration noted to right 4 th finger

## 2017-10-22 NOTE — ED Notes (Signed)
Pt verbalized understanding of discharge instructions. NAD at this time. 

## 2017-10-22 NOTE — Discharge Instructions (Signed)
Take Tylenol as needed for pain during the day or if you are driving.  You may take Percocet if you are not driving or drinking alcohol.  Keep area clean and dry.  Leave dressing on today.  Remove dressing immediately if it becomes dirty.  Clean area daily with mild soap and water and allowed to dry completely before covering it.  Sutures should be removed in 10 to 12 days.  This can be done at an urgent care or you may return to the emergency department.  Elevate hand if you are experiencing any swelling and do not wear jewelry on your ring finger. If there is any pus, redness or fever you should be seen earlier for antibiotics.

## 2018-12-21 ENCOUNTER — Other Ambulatory Visit: Payer: Self-pay

## 2018-12-21 DIAGNOSIS — Z20822 Contact with and (suspected) exposure to covid-19: Secondary | ICD-10-CM

## 2018-12-24 LAB — NOVEL CORONAVIRUS, NAA: SARS-CoV-2, NAA: NOT DETECTED

## 2019-04-10 DIAGNOSIS — F319 Bipolar disorder, unspecified: Secondary | ICD-10-CM | POA: Diagnosis not present

## 2019-04-10 DIAGNOSIS — I10 Essential (primary) hypertension: Secondary | ICD-10-CM | POA: Diagnosis not present

## 2019-04-10 DIAGNOSIS — D559 Anemia due to enzyme disorder, unspecified: Secondary | ICD-10-CM | POA: Diagnosis not present

## 2019-04-10 DIAGNOSIS — J302 Other seasonal allergic rhinitis: Secondary | ICD-10-CM | POA: Diagnosis not present

## 2019-04-10 DIAGNOSIS — E034 Atrophy of thyroid (acquired): Secondary | ICD-10-CM | POA: Diagnosis not present

## 2019-09-05 DIAGNOSIS — X32XXXA Exposure to sunlight, initial encounter: Secondary | ICD-10-CM | POA: Diagnosis not present

## 2019-09-05 DIAGNOSIS — B078 Other viral warts: Secondary | ICD-10-CM | POA: Diagnosis not present

## 2019-09-05 DIAGNOSIS — L820 Inflamed seborrheic keratosis: Secondary | ICD-10-CM | POA: Diagnosis not present

## 2019-09-05 DIAGNOSIS — L570 Actinic keratosis: Secondary | ICD-10-CM | POA: Diagnosis not present

## 2019-11-02 DIAGNOSIS — E034 Atrophy of thyroid (acquired): Secondary | ICD-10-CM | POA: Diagnosis not present

## 2019-11-30 ENCOUNTER — Other Ambulatory Visit: Payer: Self-pay

## 2019-12-01 LAB — CBC WITH DIFFERENTIAL/PLATELET
Basophils Absolute: 0 10*3/uL (ref 0.0–0.2)
Basos: 0 %
EOS (ABSOLUTE): 0.1 10*3/uL (ref 0.0–0.4)
Eos: 2 %
Hematocrit: 40.9 % (ref 34.0–46.6)
Hemoglobin: 13.8 g/dL (ref 11.1–15.9)
Immature Grans (Abs): 0 10*3/uL (ref 0.0–0.1)
Immature Granulocytes: 0 %
Lymphocytes Absolute: 2.5 10*3/uL (ref 0.7–3.1)
Lymphs: 47 %
MCH: 31.6 pg (ref 26.6–33.0)
MCHC: 33.7 g/dL (ref 31.5–35.7)
MCV: 94 fL (ref 79–97)
Monocytes Absolute: 0.5 10*3/uL (ref 0.1–0.9)
Monocytes: 9 %
Neutrophils Absolute: 2.3 10*3/uL (ref 1.4–7.0)
Neutrophils: 42 %
Platelets: 247 10*3/uL (ref 150–450)
RBC: 4.37 x10E6/uL (ref 3.77–5.28)
RDW: 12.3 % (ref 11.7–15.4)
WBC: 5.5 10*3/uL (ref 3.4–10.8)

## 2019-12-01 LAB — COMPREHENSIVE METABOLIC PANEL
ALT: 16 IU/L (ref 0–32)
AST: 18 IU/L (ref 0–40)
Albumin/Globulin Ratio: 2.1 (ref 1.2–2.2)
Albumin: 4.8 g/dL (ref 3.8–4.9)
Alkaline Phosphatase: 60 IU/L (ref 44–121)
BUN/Creatinine Ratio: 16 (ref 9–23)
BUN: 15 mg/dL (ref 6–24)
Bilirubin Total: 0.3 mg/dL (ref 0.0–1.2)
CO2: 25 mmol/L (ref 20–29)
Calcium: 9.9 mg/dL (ref 8.7–10.2)
Chloride: 98 mmol/L (ref 96–106)
Creatinine, Ser: 0.94 mg/dL (ref 0.57–1.00)
GFR calc Af Amer: 78 mL/min/{1.73_m2} (ref >59)
GFR calc non Af Amer: 68 mL/min/{1.73_m2} (ref >59)
Globulin, Total: 2.3 g/dL (ref 1.5–4.5)
Glucose: 87 mg/dL (ref 65–99)
Potassium: 4.6 mmol/L (ref 3.5–5.2)
Sodium: 141 mmol/L (ref 134–144)
Total Protein: 7.1 g/dL (ref 6.0–8.5)

## 2019-12-01 LAB — LIPID PANEL W/O CHOL/HDL RATIO
Cholesterol, Total: 192 mg/dL (ref 100–199)
HDL: 92 mg/dL (ref >39)
LDL Chol Calc (NIH): 91 mg/dL (ref 0–99)
Triglycerides: 43 mg/dL (ref 0–149)
VLDL Cholesterol Cal: 9 mg/dL (ref 5–40)

## 2019-12-01 LAB — TSH: TSH: 0.451 u[IU]/mL (ref 0.450–4.500)

## 2020-05-20 ENCOUNTER — Ambulatory Visit (INDEPENDENT_AMBULATORY_CARE_PROVIDER_SITE_OTHER): Payer: BC Managed Care – PPO | Admitting: Obstetrics and Gynecology

## 2020-05-20 ENCOUNTER — Encounter: Payer: Self-pay | Admitting: Obstetrics and Gynecology

## 2020-05-20 ENCOUNTER — Other Ambulatory Visit: Payer: Self-pay

## 2020-05-20 ENCOUNTER — Other Ambulatory Visit (HOSPITAL_COMMUNITY)
Admission: RE | Admit: 2020-05-20 | Discharge: 2020-05-20 | Disposition: A | Discharge status: Home / Self Care | Payer: 59 | Source: Ambulatory Visit | Attending: Obstetrics and Gynecology | Admitting: Obstetrics and Gynecology

## 2020-05-20 VITALS — BP 126/84 | Ht 66.0 in | Wt 154.0 lb

## 2020-05-20 DIAGNOSIS — N941 Unspecified dyspareunia: Secondary | ICD-10-CM

## 2020-05-20 DIAGNOSIS — Z01419 Encounter for gynecological examination (general) (routine) without abnormal findings: Secondary | ICD-10-CM

## 2020-05-20 DIAGNOSIS — Z1331 Encounter for screening for depression: Secondary | ICD-10-CM

## 2020-05-20 DIAGNOSIS — N952 Postmenopausal atrophic vaginitis: Secondary | ICD-10-CM | POA: Diagnosis not present

## 2020-05-20 DIAGNOSIS — Z124 Encounter for screening for malignant neoplasm of cervix: Secondary | ICD-10-CM

## 2020-05-20 DIAGNOSIS — Z1339 Encounter for screening examination for other mental health and behavioral disorders: Secondary | ICD-10-CM

## 2020-05-20 NOTE — Progress Notes (Signed)
Routine Annual Gynecology Examination   PCP: Patient, No Pcp Per (Inactive)  Chief Complaint  Patient presents with  . Annual Exam   History of Present Illness: Patient is a 59 y.o. G2P1001 presents for annual exam. The patient has no complaints today.   Menopausal bleeding: denies  Menopausal symptoms: denies  Breast symptoms: denies  Last pap smear: 06/15/2016.  Result Normal  Last mammogram: ?2018 with biopsy.  Last colonoscopy: 6-7 years ago. Normal with follow up 10 years.   She does have pain with intercourse.   Past Medical History:  Diagnosis Date  . ANXIETY 09/17/2006  . BREAST IMPLANTS, BILATERAL, HX OF 07/27/2006  . FATIGUE, CHRONIC 07/27/2006  . GRAVES' DISEASE 03/13/2010  . Hyperthyroidism   . Hypothyroidism    was treated when she was 58-28 yo    Past Surgical History:  Procedure Laterality Date  . AUGMENTATION MAMMAPLASTY Bilateral   . BREAST CAPSULECTOMY WITH IMPLANT EXCHANGE Left 06/05/2016   Procedure: LEFT BREAST CAPSULECTOMY REMOVAL OF SALINE IMPLANTS;  Surgeon: Glenna Fellows, MD;  Location: [facility];  Service: Plastics;  Laterality: Left;    Prior to Admission medications   Medication Sig Start Date End Date Taking? Authorizing Provider  buPROPion (WELLBUTRIN XL) 300 MG 24 hr tablet Take 300 mg by mouth every morning.   Yes [provider]  clonazePAM (KLONOPIN) 0.5 MG tablet Take 0.5 mg by mouth 3 (three) times daily as needed. 05/13/20  Yes [provider]  FLUoxetine (PROZAC) 40 MG capsule Take 40 mg by mouth daily. 04/24/20  Yes [provider]  fluticasone (FLONASE) 50 MCG/ACT nasal spray Place 1 spray into both nostrils daily. 04/24/20  Yes [provider]  levothyroxine (SYNTHROID) 50 MCG tablet Take 50 mcg by mouth daily. 04/24/20  Yes [provider]  Omega-3 Fatty Acids (FISH OIL PO) Take by mouth.   Yes [provider]  NON FORMULARY CAL, MG, B12  Patient not taking:  Reported on 05/20/2020    [provider]  NON FORMULARY MVI  Patient not taking: Reported on 05/20/2020    [provider]  Ambien 6.25 mg PRN   Allergies  Allergen Reactions  . Codeine     REACTION: hives   Obstetric History: G2P1011  Social History   Socioeconomic History  . Marital status: Single    Spouse name: Not on file  . Number of children: 1  . Years of education: Not on file  . Highest education level: Not on file  Occupational History  . Occupation: Systems developer  Tobacco Use  . Smoking status: Never Smoker  . Smokeless tobacco: Never Used  Vaping Use  . Vaping Use: Never used  Substance and Sexual Activity  . Alcohol use: No  . Drug use: No  . Sexual activity: Yes    Birth control/protection: Post-menopausal  Other Topics Concern  . Not on file  Social History Narrative   Divorced-engaged to remarry   Describes herself as "Hyper"   Regular exercise-yes         Social Determinants of Health   Financial Resource Strain: Not on file  Food Insecurity: Not on file  Transportation Needs: Not on file  Physical Activity: Not on file  Stress: Not on file  Social Connections: Not on file  Intimate Partner Violence: Not on file    Family History  Problem Relation Age of Onset  . Alcohol abuse Mother   . Uterine cancer Mother   . Cancer Father  Prostate Cancer, Lung Cancer  . Hyperlipidemia Father   . Cancer Other        Breast Cancer    Review of Systems  Constitutional: Negative.   HENT: Negative.   Eyes: Negative.   Respiratory: Negative.   Cardiovascular: Negative.   Gastrointestinal: Negative.   Genitourinary: Negative.   Musculoskeletal: Negative.   Skin: Negative.   Neurological: Negative.   Psychiatric/Behavioral: Negative.      Physical Exam Vitals: BP 126/84   Ht 5\' 6"  (1.676 m)   Wt 154 lb (69.9 kg)   LMP 03/21/2010   BMI 24.86 kg/m   Physical Exam Constitutional:      General: She is not in  acute distress.    Appearance: Normal appearance. She is well-developed.  Genitourinary:     Vulva, bladder and urethral meatus normal.     Right Labia: No rash, tenderness, lesions, skin changes or Bartholin's cyst.    Left Labia: No tenderness, lesions, skin changes, Bartholin's cyst or rash.    No inguinal adenopathy present in the right or left side.    Pelvic Tanner Score: 5/5.    No vaginal discharge, erythema, tenderness or bleeding.     No vaginal prolapse present.    Moderate vaginal atrophy present.     Right Adnexa: not tender, not full and no mass present.    Left Adnexa: not tender, not full and no mass present.    No cervical motion tenderness, discharge, lesion or polyp.     Uterus is not enlarged or tender.     No uterine mass detected.    Uterus is anteverted.     Pelvic exam was performed with patient in the lithotomy position.  Breasts:     Right: Breast implant (implant present on right) present. No swelling, inverted nipple, mass, nipple discharge, skin change, tenderness, axillary adenopathy or supraclavicular adenopathy.     Left: No swelling, inverted nipple, mass, nipple discharge, skin change, tenderness, breast implant (implant ABSENT on left), axillary adenopathy or supraclavicular adenopathy.    HENT:     Head: Normocephalic and atraumatic.  Eyes:     General: No scleral icterus.    Conjunctiva/sclera: Conjunctivae normal.  Neck:     Thyroid: No thyromegaly.  Cardiovascular:     Rate and Rhythm: Normal rate and regular rhythm.     Heart sounds: No murmur heard. No friction rub. No gallop.   Pulmonary:     Effort: Pulmonary effort is normal. No respiratory distress.     Breath sounds: Normal breath sounds. No wheezing or rales.  Abdominal:     General: Bowel sounds are normal. There is no distension.     Palpations: Abdomen is soft. There is no mass.     Tenderness: There is no abdominal tenderness. There is no guarding or rebound.     Hernia:  There is no hernia in the left inguinal area or right inguinal area.  Musculoskeletal:        General: No swelling or tenderness. Normal range of motion.     Cervical back: Normal range of motion and neck supple.  Lymphadenopathy:     Cervical: No cervical adenopathy.     Upper Body:     Right upper body: No supraclavicular or axillary adenopathy.     Left upper body: No supraclavicular or axillary adenopathy.     Lower Body: No right inguinal adenopathy. No left inguinal adenopathy.  Neurological:     General: No focal deficit present.  Mental Status: She is alert and oriented to person, place, and time.     Cranial Nerves: No cranial nerve deficit.  Skin:    General: Skin is warm and dry.     Findings: No erythema or rash.  Psychiatric:        Mood and Affect: Mood normal.        Behavior: Behavior normal.        Judgment: Judgment normal.     Female chaperone present for pelvic and breast  portions of the physical exam  Results: AUDIT Questionnaire (screen for alcoholism): low PHQ-9: not done due to treatment with another provider   Assessment and Plan:  61 y.o. No obstetric history on file. female here for routine annual gynecologic examination  Plan: Problem List Items Addressed This Visit   None   Visit Diagnoses    Women's annual routine gynecological examination    -  Primary   Relevant Medications   conjugated estrogens (PREMARIN) vaginal cream (Start on 05/23/2020)   Other Relevant Orders   Cytology - PAP   Screening for alcoholism       Pap smear for cervical cancer screening       Relevant Orders   Cytology - PAP   Vaginal atrophy       Relevant Medications   conjugated estrogens (PREMARIN) vaginal cream (Start on 05/23/2020)   Dyspareunia, female       Relevant Medications   conjugated estrogens (PREMARIN) vaginal cream (Start on 05/23/2020)      Screening: -- Blood pressure screen normal (borderline. Continue to monitor by PCP) -- Colonoscopy - not  due -- Mammogram - due. Patient to call [facility] to arrange. She understands that it is her responsibility to arrange this. -- Weight screening: normal -- Depression screening negative (PHQ-9) -- Nutrition: normal -- cholesterol screening: per PCP -- osteoporosis screening: not due -- tobacco screening: not using -- alcohol screening: AUDIT questionnaire indicates low-risk usage. -- family history of breast cancer screening: done. not at high risk. -- no evidence of domestic violence or intimate partner violence. -- STD screening: gonorrhea/chlamydia NAAT not collected per patient request. -- pap smear collected per ASCCP guidelines -- HPV vaccination series: not eligilbe   Dyspareunia and vaginal atrophy due to hypoestrogeniemia: Discussed treatment for dyspareunia, which appears to be the biggest issue with this. She does not have burning, itching, and other irritative symptoms associated with hypoestrogenism.  Discussed treatment with lubricants, which she has tried. Discussed treatment with topical estrogen. Discussed benefits and risks.  Discussed how to use and expectations regarding efficacy and timing of seeing results. She would like to try estrogen in order to be able to be comfortable with intercourse.    Thomasene Mohair, MD 05/20/2020 4:27 PM

## 2020-05-21 ENCOUNTER — Encounter: Payer: Self-pay | Admitting: Obstetrics and Gynecology

## 2020-05-21 MED ORDER — ESTROGENS, CONJUGATED 0.625 MG/GM VA CREA: 0.5000 | TOPICAL_CREAM | VAGINAL | 3 refills | Status: DC

## 2020-05-22 LAB — CYTOLOGY - PAP
Comment: NEGATIVE
Diagnosis: NEGATIVE
High risk HPV: NEGATIVE

## 2020-11-04 DIAGNOSIS — E034 Atrophy of thyroid (acquired): Secondary | ICD-10-CM | POA: Diagnosis not present

## 2020-11-18 DIAGNOSIS — M7989 Other specified soft tissue disorders: Secondary | ICD-10-CM | POA: Diagnosis not present

## 2021-01-29 DIAGNOSIS — S92324D Nondisplaced fracture of second metatarsal bone, right foot, subsequent encounter for fracture with routine healing: Secondary | ICD-10-CM | POA: Diagnosis not present

## 2021-01-29 DIAGNOSIS — S92344D Nondisplaced fracture of fourth metatarsal bone, right foot, subsequent encounter for fracture with routine healing: Secondary | ICD-10-CM | POA: Diagnosis not present

## 2021-01-29 DIAGNOSIS — S93621D Sprain of tarsometatarsal ligament of right foot, subsequent encounter: Secondary | ICD-10-CM | POA: Diagnosis not present

## 2021-01-29 DIAGNOSIS — M25474 Effusion, right foot: Secondary | ICD-10-CM | POA: Diagnosis not present

## 2021-02-17 DIAGNOSIS — J019 Acute sinusitis, unspecified: Secondary | ICD-10-CM | POA: Diagnosis not present

## 2021-06-17 DIAGNOSIS — E039 Hypothyroidism, unspecified: Secondary | ICD-10-CM | POA: Diagnosis not present

## 2021-06-17 DIAGNOSIS — E034 Atrophy of thyroid (acquired): Secondary | ICD-10-CM | POA: Diagnosis not present

## 2021-06-17 DIAGNOSIS — F419 Anxiety disorder, unspecified: Secondary | ICD-10-CM | POA: Diagnosis not present

## 2021-07-30 DIAGNOSIS — H6123 Impacted cerumen, bilateral: Secondary | ICD-10-CM | POA: Diagnosis not present

## 2021-07-30 DIAGNOSIS — R42 Dizziness and giddiness: Secondary | ICD-10-CM | POA: Diagnosis not present

## 2021-07-30 DIAGNOSIS — H60332 Swimmer's ear, left ear: Secondary | ICD-10-CM | POA: Diagnosis not present

## 2021-08-13 DIAGNOSIS — H6983 Other specified disorders of Eustachian tube, bilateral: Secondary | ICD-10-CM | POA: Diagnosis not present

## 2021-08-13 DIAGNOSIS — H6122 Impacted cerumen, left ear: Secondary | ICD-10-CM | POA: Diagnosis not present

## 2021-09-07 DIAGNOSIS — S63502A Unspecified sprain of left wrist, initial encounter: Secondary | ICD-10-CM | POA: Diagnosis not present

## 2021-09-07 DIAGNOSIS — M654 Radial styloid tenosynovitis [de Quervain]: Secondary | ICD-10-CM | POA: Diagnosis not present

## 2021-11-20 DIAGNOSIS — E039 Hypothyroidism, unspecified: Secondary | ICD-10-CM | POA: Diagnosis not present

## 2021-12-09 DIAGNOSIS — Z79899 Other long term (current) drug therapy: Secondary | ICD-10-CM | POA: Diagnosis not present

## 2021-12-09 DIAGNOSIS — E034 Atrophy of thyroid (acquired): Secondary | ICD-10-CM | POA: Diagnosis not present

## 2021-12-17 DIAGNOSIS — M654 Radial styloid tenosynovitis [de Quervain]: Secondary | ICD-10-CM | POA: Diagnosis not present

## 2022-02-21 DIAGNOSIS — F331 Major depressive disorder, recurrent, moderate: Secondary | ICD-10-CM | POA: Diagnosis not present

## 2022-04-08 DIAGNOSIS — E034 Atrophy of thyroid (acquired): Secondary | ICD-10-CM | POA: Diagnosis not present

## 2022-06-24 DIAGNOSIS — E034 Atrophy of thyroid (acquired): Secondary | ICD-10-CM | POA: Diagnosis not present

## 2022-06-24 DIAGNOSIS — F331 Major depressive disorder, recurrent, moderate: Secondary | ICD-10-CM | POA: Diagnosis not present

## 2023-01-01 ENCOUNTER — Encounter: Payer: Self-pay | Admitting: Family Medicine

## 2023-01-01 ENCOUNTER — Ambulatory Visit (INDEPENDENT_AMBULATORY_CARE_PROVIDER_SITE_OTHER): Payer: BC Managed Care – PPO | Admitting: Family Medicine

## 2023-01-01 VITALS — BP 110/81 | HR 76 | Temp 97.3°F | Ht 66.5 in | Wt 153.9 lb

## 2023-01-01 DIAGNOSIS — Z130 Encounter for screening for diseases of the blood and blood-forming organs and certain disorders involving the immune mechanism: Secondary | ICD-10-CM

## 2023-01-01 DIAGNOSIS — Z7689 Persons encountering health services in other specified circumstances: Secondary | ICD-10-CM

## 2023-01-01 DIAGNOSIS — Z1211 Encounter for screening for malignant neoplasm of colon: Secondary | ICD-10-CM

## 2023-01-01 DIAGNOSIS — N951 Menopausal and female climacteric states: Secondary | ICD-10-CM

## 2023-01-01 DIAGNOSIS — F418 Other specified anxiety disorders: Secondary | ICD-10-CM

## 2023-01-01 DIAGNOSIS — N393 Stress incontinence (female) (male): Secondary | ICD-10-CM | POA: Diagnosis not present

## 2023-01-01 DIAGNOSIS — Z131 Encounter for screening for diabetes mellitus: Secondary | ICD-10-CM

## 2023-01-01 DIAGNOSIS — R030 Elevated blood-pressure reading, without diagnosis of hypertension: Secondary | ICD-10-CM | POA: Diagnosis not present

## 2023-01-01 DIAGNOSIS — E039 Hypothyroidism, unspecified: Secondary | ICD-10-CM

## 2023-01-01 DIAGNOSIS — Z1231 Encounter for screening mammogram for malignant neoplasm of breast: Secondary | ICD-10-CM

## 2023-01-01 MED ORDER — PROPRANOLOL HCL 10 MG PO TABS: 10.0000 mg | ORAL_TABLET | Freq: Two times a day (BID) | ORAL | 0 refills | Status: AC | PRN

## 2023-01-01 MED ORDER — ESTRADIOL 0.1 MG/GM VA CREA: 1.0000 | TOPICAL_CREAM | VAGINAL | 12 refills | Status: DC

## 2023-01-01 NOTE — Progress Notes (Unsigned)
New Patient Office Visit  Introduced to nurse practitioner role and practice setting.  All questions answered.  Discussed provider/patient relationship and expectations.   Subjective    Patient ID: Debra Quinn, female    DOB: [birth year]  Age: 62 y.o. MRN: [id number]  CC:  Chief Complaint  Patient presents with   Establish Care   HPI Debra Quinn presents to establish care. States overall she is in good health, needing primary care provider. She sees Marletta Lor, NP for her anxiety and depression management. Her husband's name is Clide Cliff, she has a daughter who lives in [place]. Concerns today are stress urinary incontinence, vaginal dryness, and situation anxiety with work presentations.   Anxiety - Work-life balance is stressful, but she does wellness, has wave machine, air purifier, and drinks camomile/lavender tea. She is managed with Wellbutrin 300 mg LX and Fluoxetine 40 mg tablet for daily maintenance and klonopin 0.5 mg tablet as needed for panic. She is happy with current mgmt, states Marletta Lor, NP manages these prescriptions. Does states with her job she has to do big presentations which causes high stress and panic in the moment, she was wondering if there was anything that doesn't make her drowsy that could help with these situations.   Urinary incontinence - states the last few months she has been peeing her self with laughing, coughing, jumping, exercise. She says when it happens she can't stop. She wears pads, and carries extra pants with her. Denies dysuria, odor, burning, itchiness, hematuria.  Vaginal dryness - described painful sex with husband post menopausal, states premarin was work, but not affordable - interested in different product.   Outpatient Encounter Medications as of 01/01/2023  Medication Sig   buPROPion (WELLBUTRIN XL) 300 MG 24 hr tablet Take 300 mg by mouth every morning.   clonazePAM (KLONOPIN) 0.5 MG tablet Take 0.5 mg by mouth 3 (three) times daily as  needed.   estradiol (ESTRACE VAGINAL) 0.1 MG/GM vaginal cream Place 1 Applicatorful vaginally 3 (three) times a week. Apply pea size amount to external vaginal area three times a week.   FLUoxetine (PROZAC) 40 MG capsule Take 40 mg by mouth daily.   fluticasone (FLONASE) 50 MCG/ACT nasal spray Place 1 spray into both nostrils daily.   levothyroxine (SYNTHROID) 50 MCG tablet Take 50 mcg by mouth daily.   NON FORMULARY CAL, MG, B12   NON FORMULARY MVI   Omega-3 Fatty Acids (FISH OIL PO) Take by mouth.   propranolol (INDERAL) 10 MG tablet Take 1 tablet (10 mg total) by mouth 2 (two) times daily as needed.   zolpidem (AMBIEN CR) 6.25 MG CR tablet Take 6.25 mg by mouth at bedtime as needed.   [DISCONTINUED] citalopram (CELEXA) 40 MG tablet Take 40 mg by mouth daily. (Patient not taking: Reported on 01/01/2023)   [DISCONTINUED] conjugated estrogens (PREMARIN) vaginal cream Place 0.5 Applicatorfuls vaginally 2 (two) times a week. 0.5 gram vaginally at bedtime twice weekly (Patient not taking: Reported on 01/01/2023)   [DISCONTINUED] oxyCODONE-acetaminophen (PERCOCET) 5-325 MG tablet Take 1 tablet by mouth every 6 (six) hours as needed for severe pain. (Patient not taking: Reported on 01/01/2023)   No facility-administered encounter medications on file as of 01/01/2023.    Past Medical History:  Diagnosis Date   ANXIETY 09/17/2006   BREAST IMPLANTS, BILATERAL, HX OF 07/27/2006   FATIGUE, CHRONIC 07/27/2006   GRAVES' DISEASE 03/13/2010   Hyperthyroidism    Hypothyroidism    was treated when she was 40-28 yo  Past Surgical History:  Procedure Laterality Date   AUGMENTATION MAMMAPLASTY Bilateral    BREAST CAPSULECTOMY WITH IMPLANT EXCHANGE Left 06/05/2016   Procedure: LEFT BREAST CAPSULECTOMY REMOVAL OF SALINE IMPLANTS;  Surgeon: Glenna Fellows, MD;  Location: [facility];  Service: Plastics;  Laterality: Left;    Family History  Problem Relation Age of Onset   Alcohol abuse  Mother    Uterine cancer Mother    Cancer Father        Prostate Cancer, Lung Cancer   Hyperlipidemia Father    Diabetes Sister    Hypertension Sister    Cancer Other        Breast Cancer    Social History   Socioeconomic History   Marital status: Single    Spouse name: Not on file   Number of children: 1   Years of education: Not on file   Highest education level: Not on file  Occupational History   Occupation: Systems developer  Tobacco Use   Smoking status: Never   Smokeless tobacco: Never  Vaping Use   Vaping status: Never Used  Substance and Sexual Activity   Alcohol use: Yes    Comment: social.  Patient reports one drink per month   Drug use: No   Sexual activity: Yes    Birth control/protection: Post-menopausal  Other Topics Concern   Not on file  Social History Narrative   Divorced-engaged to remarry   Describes herself as "Hyper"   Regular exercise-yes         Social Drivers of Corporate investment banker Strain: Not on file  Food Insecurity: Not on file  Transportation Needs: Not on file  Physical Activity: Not on file  Stress: Not on file  Social Connections: Not on file  Intimate Partner Violence: Not on file    Review of Systems  All other systems reviewed and are negative.    Objective    BP 110/81 (BP Location: Left Arm, Patient Position: Sitting, Cuff Size: Normal)   Pulse 76   Temp (!) 97.3 F (36.3 C) (Oral)   Ht 5' 6.5" (1.689 m)   Wt 153 lb 14.4 oz (69.8 kg)   LMP 03/21/2010   SpO2 100%   BMI 24.47 kg/m   Physical Exam Constitutional:      Appearance: Normal appearance. She is normal weight.  HENT:     Head: Normocephalic.     Mouth/Throat:     Mouth: Mucous membranes are moist.  Eyes:     Extraocular Movements: Extraocular movements intact.     Pupils: Pupils are equal, round, and reactive to light.  Cardiovascular:     Rate and Rhythm: Normal rate and regular rhythm.     Pulses: Normal pulses.     Heart sounds:  Normal heart sounds.  Pulmonary:     Effort: Pulmonary effort is normal.     Breath sounds: Normal breath sounds.  Musculoskeletal:        General: Normal range of motion.     Cervical back: Normal range of motion and neck supple.  Skin:    General: Skin is warm and dry.     Capillary Refill: Capillary refill takes less than 2 seconds.  Neurological:     General: No focal deficit present.     Mental Status: She is alert and oriented to person, place, and time. Mental status is at baseline.  Psychiatric:        Attention and Perception: Perception normal. She is  inattentive.        Mood and Affect: Affect normal. Mood is anxious.        Speech: Speech is tangential.        Behavior: Behavior is cooperative.        Thought Content: Thought content normal. Thought content is not paranoid or delusional. Thought content does not include homicidal or suicidal ideation.        Cognition and Memory: Cognition normal.        Judgment: Judgment normal.         Assessment & Plan:   Encounter to establish care with new doctor Assessment & Plan: Welcome to [facility] Will screen for breast CA, colon CA, DMII, and anemia Problems addressed today - situational anxiety, vaginal dryness, and stress urinary incontinence   Situational anxiety Assessment & Plan: Will trial propanolol 10 mg tablet for situation anxiety. Take 30 minutes before speaking event for full effect of medications.  Orders: -     Propranolol HCl; Take 1 tablet (10 mg total) by mouth 2 (two) times daily as needed.  Dispense: 30 tablet; Refill: 0  Urinary, incontinence, stress female Assessment & Plan: Does not appear infectious in nature, rather post menopausal stress incontinence. Pt decline referral for pelvic floor therapy and requested Urology referral for further work up.   Orders: -     Ambulatory referral to Urology  Menopausal vaginal dryness Assessment & Plan: - Estradiol vaginal topical cream ordered -  lubricants for sexual activity - keep perineal area clean, cotton undergarments  Orders: -     Estradiol; Place 1 Applicatorful vaginally 3 (three) times a week. Apply pea size amount to external vaginal area three times a week.  Dispense: 42.5 g; Refill: 12  Hypothyroidism, unspecified type Assessment & Plan: Pt hx of hypothyroidism, on levothyroxine daily - will check TSH +T4. Denies any concerning thyroid symptoms today.   Orders: -     TSH + free T4  Elevated BP without diagnosis of hypertension Assessment & Plan: - DBP mildly elevated, no hx of HTN - no headaches, vision changes - CMP, Lipid panel - monitor at home - low sodium, increase water intake  Orders: -     Comprehensive metabolic panel -     Lipid panel  Encounter for screening mammogram for malignant neoplasm of breast -     3D Screening Mammogram, Left and Right; Future  Screening for colon cancer Assessment & Plan: Colonscopy never done - Cologuard order  Orders: -     Cologuard  Screening for deficiency anemia -     CBC with Differential/Platelet  Screening for diabetes mellitus -     Hemoglobin A1c   Will communicate lab results.  Return for annual physical.   I, Sallee Provencal, FNP, have reviewed all documentation for this visit. The documentation on 01/02/23 for the exam, diagnosis, procedures, and orders are all accurate and complete.   Sallee Provencal, FNP

## 2023-01-02 ENCOUNTER — Encounter: Payer: Self-pay | Admitting: Family Medicine

## 2023-01-02 DIAGNOSIS — R030 Elevated blood-pressure reading, without diagnosis of hypertension: Secondary | ICD-10-CM | POA: Insufficient documentation

## 2023-01-02 DIAGNOSIS — Z7689 Persons encountering health services in other specified circumstances: Secondary | ICD-10-CM | POA: Insufficient documentation

## 2023-01-02 DIAGNOSIS — E039 Hypothyroidism, unspecified: Secondary | ICD-10-CM | POA: Insufficient documentation

## 2023-01-02 DIAGNOSIS — N393 Stress incontinence (female) (male): Secondary | ICD-10-CM | POA: Insufficient documentation

## 2023-01-02 DIAGNOSIS — F418 Other specified anxiety disorders: Secondary | ICD-10-CM | POA: Insufficient documentation

## 2023-01-02 DIAGNOSIS — Z130 Encounter for screening for diseases of the blood and blood-forming organs and certain disorders involving the immune mechanism: Secondary | ICD-10-CM | POA: Insufficient documentation

## 2023-01-02 DIAGNOSIS — N951 Menopausal and female climacteric states: Secondary | ICD-10-CM | POA: Insufficient documentation

## 2023-01-02 DIAGNOSIS — Z131 Encounter for screening for diabetes mellitus: Secondary | ICD-10-CM | POA: Insufficient documentation

## 2023-01-02 DIAGNOSIS — Z1231 Encounter for screening mammogram for malignant neoplasm of breast: Secondary | ICD-10-CM | POA: Insufficient documentation

## 2023-01-02 DIAGNOSIS — Z1211 Encounter for screening for malignant neoplasm of colon: Secondary | ICD-10-CM | POA: Insufficient documentation

## 2023-01-02 LAB — CBC WITH DIFFERENTIAL/PLATELET
Basophils Absolute: 0 10*3/uL (ref 0.0–0.2)
Basos: 0 %
EOS (ABSOLUTE): 0.2 10*3/uL (ref 0.0–0.4)
Eos: 2 %
Hematocrit: 39.6 % (ref 34.0–46.6)
Hemoglobin: 12.9 g/dL (ref 11.1–15.9)
Immature Grans (Abs): 0 10*3/uL (ref 0.0–0.1)
Immature Granulocytes: 0 %
Lymphocytes Absolute: 3 10*3/uL (ref 0.7–3.1)
Lymphs: 38 %
MCH: 30.6 pg (ref 26.6–33.0)
MCHC: 32.6 g/dL (ref 31.5–35.7)
MCV: 94 fL (ref 79–97)
Monocytes Absolute: 0.6 10*3/uL (ref 0.1–0.9)
Monocytes: 8 %
Neutrophils Absolute: 4.2 10*3/uL (ref 1.4–7.0)
Neutrophils: 52 %
Platelets: 243 10*3/uL (ref 150–450)
RBC: 4.22 x10E6/uL (ref 3.77–5.28)
RDW: 12.4 % (ref 11.7–15.4)
WBC: 8 10*3/uL (ref 3.4–10.8)

## 2023-01-02 LAB — LIPID PANEL
Chol/HDL Ratio: 2.1 {ratio} (ref 0.0–4.4)
Cholesterol, Total: 217 mg/dL — ABNORMAL HIGH (ref 100–199)
HDL: 101 mg/dL (ref >39)
LDL Chol Calc (NIH): 107 mg/dL — ABNORMAL HIGH (ref 0–99)
Triglycerides: 53 mg/dL (ref 0–149)
VLDL Cholesterol Cal: 9 mg/dL (ref 5–40)

## 2023-01-02 LAB — COMPREHENSIVE METABOLIC PANEL
ALT: 31 [IU]/L (ref 0–32)
AST: 27 [IU]/L (ref 0–40)
Albumin: 4.4 g/dL (ref 3.8–4.9)
Alkaline Phosphatase: 71 [IU]/L (ref 44–121)
BUN/Creatinine Ratio: 18 (ref 12–28)
BUN: 16 mg/dL (ref 8–27)
Bilirubin Total: 0.3 mg/dL (ref 0.0–1.2)
CO2: 25 mmol/L (ref 20–29)
Calcium: 9.6 mg/dL (ref 8.7–10.3)
Chloride: 98 mmol/L (ref 96–106)
Creatinine, Ser: 0.87 mg/dL (ref 0.57–1.00)
Globulin, Total: 2.2 g/dL (ref 1.5–4.5)
Glucose: 91 mg/dL (ref 70–99)
Potassium: 4.7 mmol/L (ref 3.5–5.2)
Sodium: 140 mmol/L (ref 134–144)
Total Protein: 6.6 g/dL (ref 6.0–8.5)
eGFR: 76 mL/min/{1.73_m2} (ref >59)

## 2023-01-02 LAB — HEMOGLOBIN A1C
Est. average glucose Bld gHb Est-mCnc: 114 mg/dL
Hgb A1c MFr Bld: 5.6 % (ref 4.8–5.6)

## 2023-01-02 LAB — TSH+FREE T4
Free T4: 1.22 ng/dL (ref 0.82–1.77)
TSH: 0.226 u[IU]/mL — ABNORMAL LOW (ref 0.450–4.500)

## 2023-01-02 NOTE — Assessment & Plan Note (Signed)
-   DBP mildly elevated, no hx of HTN - no headaches, vision changes - CMP, Lipid panel - monitor at home - low sodium, increase water intake

## 2023-01-02 NOTE — Assessment & Plan Note (Addendum)
-   Estradiol vaginal topical cream ordered - lubricants for sexual activity - keep perineal area clean, cotton undergarments

## 2023-01-02 NOTE — Assessment & Plan Note (Signed)
Will trial propanolol 10 mg tablet for situation anxiety. Take 30 minutes before speaking event for full effect of medications.

## 2023-01-02 NOTE — Assessment & Plan Note (Signed)
Colonscopy never done - Cologuard order

## 2023-01-02 NOTE — Assessment & Plan Note (Signed)
Welcome to [facility] Will screen for breast CA, colon CA, DMII, and anemia Problems addressed today - situational anxiety, vaginal dryness, and stress urinary incontinence

## 2023-01-02 NOTE — Assessment & Plan Note (Addendum)
Does not appear infectious in nature, rather post menopausal stress incontinence. Discussed pelvic floor therapy as pt stated she already does kegal exercise, but pt declined referral, and requested Urology referral instead for further work up.  - Continue kegal exercises at home - bladder training - avoiding caffeine, limiting excessive fluids throughout day, being aware of urge and attempting to control feeling,  - topical vaginal estrogen, Estradiol, can help with vagina muscle atrophy and strengthening tissue and muscles.  - given referral to urology, appreciate their recommendations as well.

## 2023-01-02 NOTE — Assessment & Plan Note (Signed)
Pt hx of hypothyroidism, on levothyroxine daily - will check TSH +T4. Denies any concerning thyroid symptoms today.

## 2023-01-04 ENCOUNTER — Telehealth: Payer: Self-pay

## 2023-01-04 ENCOUNTER — Other Ambulatory Visit: Payer: Self-pay | Admitting: Family Medicine

## 2023-01-04 ENCOUNTER — Encounter: Payer: Self-pay | Admitting: Family Medicine

## 2023-01-04 DIAGNOSIS — E039 Hypothyroidism, unspecified: Secondary | ICD-10-CM

## 2023-01-04 DIAGNOSIS — N951 Menopausal and female climacteric states: Secondary | ICD-10-CM

## 2023-01-04 MED ORDER — LEVOTHYROXINE SODIUM 25 MCG PO TABS: 37.5000 ug | ORAL_TABLET | Freq: Every day | ORAL | 3 refills | Status: AC

## 2023-01-04 MED ORDER — ESTRADIOL 0.1 MG/GM VA CREA: 1.0000 | TOPICAL_CREAM | Freq: Every evening | VAGINAL | 12 refills | Status: DC

## 2023-01-04 NOTE — Telephone Encounter (Signed)
Call from [facility] at [facility] regarding instructions for Estradiol. Sig for Rx:  Sig: Place 1 Applicatorful vaginally 3 (three) times a week. Apply pea size amount to external vaginal area three times a week.         [facility] states that they filled rx using only the 2nd instruction given:  Apply pea size amount to external vaginal area three times a week.  Please review and update if both instructions should be used.

## 2023-01-05 ENCOUNTER — Telehealth: Payer: Self-pay | Admitting: *Deleted

## 2023-01-05 NOTE — Telephone Encounter (Signed)
[facility] called---regarding estradiol (ESTRACE VAGINAL) 0.1 MG/GM vaginal cream

## 2023-01-05 NOTE — Telephone Encounter (Signed)
[facility]--called and stated --estradiol (ESTRACE VAGINAL) 0.1 MG/GM vaginal cream have multiple  instruction and needed correction. Please re-sent this meds.

## 2023-01-06 ENCOUNTER — Other Ambulatory Visit: Payer: Self-pay | Admitting: Family Medicine

## 2023-01-06 DIAGNOSIS — N951 Menopausal and female climacteric states: Secondary | ICD-10-CM

## 2023-01-06 MED ORDER — ESTRADIOL 0.1 MG/GM VA CREA: 1.0000 | TOPICAL_CREAM | VAGINAL | 0 refills | Status: DC

## 2023-01-12 ENCOUNTER — Other Ambulatory Visit: Payer: Self-pay | Admitting: Family Medicine

## 2023-01-12 DIAGNOSIS — F418 Other specified anxiety disorders: Secondary | ICD-10-CM

## 2023-01-14 ENCOUNTER — Encounter: Payer: Self-pay | Admitting: Family Medicine

## 2023-01-16 DIAGNOSIS — Z1211 Encounter for screening for malignant neoplasm of colon: Secondary | ICD-10-CM | POA: Diagnosis not present

## 2023-01-21 ENCOUNTER — Other Ambulatory Visit: Payer: Self-pay | Admitting: Family Medicine

## 2023-01-21 DIAGNOSIS — F418 Other specified anxiety disorders: Secondary | ICD-10-CM

## 2023-01-21 LAB — COLOGUARD: COLOGUARD: NEGATIVE

## 2023-01-22 ENCOUNTER — Encounter: Payer: Self-pay | Admitting: Family Medicine

## 2023-03-22 ENCOUNTER — Ambulatory Visit: Payer: Self-pay | Admitting: Urology

## 2023-05-31 ENCOUNTER — Ambulatory Visit (INDEPENDENT_AMBULATORY_CARE_PROVIDER_SITE_OTHER): Payer: Self-pay | Admitting: Urology

## 2023-05-31 VITALS — BP 112/71 | HR 102 | Ht 66.0 in | Wt 153.0 lb

## 2023-05-31 DIAGNOSIS — N393 Stress incontinence (female) (male): Secondary | ICD-10-CM | POA: Diagnosis not present

## 2023-05-31 DIAGNOSIS — N3946 Mixed incontinence: Secondary | ICD-10-CM

## 2023-05-31 LAB — MICROSCOPIC EXAMINATION

## 2023-05-31 LAB — URINALYSIS, COMPLETE
Bilirubin, UA: NEGATIVE
Glucose, UA: NEGATIVE
Ketones, UA: NEGATIVE
Leukocytes,UA: NEGATIVE
Nitrite, UA: NEGATIVE
Protein,UA: NEGATIVE
RBC, UA: NEGATIVE
Specific Gravity, UA: 1.01 (ref 1.005–1.030)
Urobilinogen, Ur: 0.2 mg/dL (ref 0.2–1.0)
pH, UA: 6 (ref 5.0–7.5)

## 2023-05-31 NOTE — Patient Instructions (Signed)

## 2023-05-31 NOTE — Progress Notes (Signed)
 05/31/2023 8:28 AM   Debra Quinn [birth year] [id number]  Referring provider: Tasia Farr, FNP [address] [place],  [place] [address]  Chief Complaint  Patient presents with   Establish Care    Urinary, incontinence, stress female     HPI: I was consulted to assess the patient's urinary incontinence.  She leaks with coughing sneezing but not bending lifting.  She leaks a lot with walking.  I do believe she has urge incontinence.  She says she cannot stop that once she starts leaking.  She does not feel a lot of urgency when she is walking the history was little bit challenging.  No bedwetting.  She wears 1 pad a day and I think the amount varies  She says she voids twice an hour and cannot hold it for 2 hours.  She gets up once or twice at night.  She reports good flow  No hysterectomy.  She has had 2 kidney stones.  No bladder surgery.  No neurologic issues.  Bowel movements normal.  No treatment.  Knows how to do Kegel exercises   PMH: Past Medical History:  Diagnosis Date   ANXIETY 09/17/2006   BREAST IMPLANTS, BILATERAL, HX OF 07/27/2006   FATIGUE, CHRONIC 07/27/2006   GRAVES' DISEASE 03/13/2010   Hyperthyroidism    Hypothyroidism    was treated when she was 4-26 yo    Surgical History: Past Surgical History:  Procedure Laterality Date   AUGMENTATION MAMMAPLASTY Bilateral    BREAST CAPSULECTOMY WITH IMPLANT EXCHANGE Left 06/05/2016   Procedure: LEFT BREAST CAPSULECTOMY REMOVAL OF SALINE IMPLANTS;  Surgeon: Alger Infield, MD;  Location: [facility];  Service: Plastics;  Laterality: Left;    Home Medications:  Allergies as of 05/31/2023       Reactions   Codeine    REACTION: hives        Medication List        Accurate as of May 31, 2023  8:28 AM. If you have any questions, ask your nurse or doctor.          buPROPion 300 MG 24 hr tablet Commonly known as: WELLBUTRIN XL Take 300 mg by mouth every morning.    clonazePAM 0.5 MG tablet Commonly known as: KLONOPIN Take 0.5 mg by mouth 3 (three) times daily as needed.   estradiol  0.1 MG/GM vaginal cream Commonly known as: ESTRACE  VAGINAL Place 1 Applicatorful vaginally 3 (three) times a week.   FISH OIL PO Take by mouth.   FLUoxetine 40 MG capsule Commonly known as: PROZAC Take 40 mg by mouth daily.   fluticasone 50 MCG/ACT nasal spray Commonly known as: FLONASE Place 1 spray into both nostrils daily.   levothyroxine  25 MCG tablet Commonly known as: SYNTHROID  Take 1.5 tablets (37.5 mcg total) by mouth daily.   NON FORMULARY CAL, MG, B12   NON FORMULARY MVI   propranolol  10 MG tablet Commonly known as: INDERAL  Take 1 tablet (10 mg total) by mouth 2 (two) times daily as needed.   zolpidem 6.25 MG CR tablet Commonly known as: AMBIEN CR Take 6.25 mg by mouth at bedtime as needed.        Allergies:  Allergies  Allergen Reactions   Codeine     REACTION: hives    Family History: Family History  Problem Relation Age of Onset   Alcohol abuse Mother    Uterine cancer Mother    Cancer Father        Prostate  Cancer, Lung Cancer   Hyperlipidemia Father    Diabetes Sister    Hypertension Sister    Cancer Other        Breast Cancer    Social History:  reports that she has never smoked. She has never used smokeless tobacco. She reports current alcohol use. She reports that she does not use drugs.  ROS:                                        Physical Exam: LMP 03/21/2010   Constitutional:  Alert and oriented, No acute distress. HEENT: [place] AT, moist mucus membranes.  Trachea midline, no masses. Cardiovascular: No clubbing, cyanosis, or edema. Respiratory: Normal respiratory effort, no increased work of breathing. GI: Abdomen is soft, nontender, nondistended, no abdominal masses GU: Mild grade 2 hypermobility bladder neck and negative cough this with a moderate cough.  No significant  prolapse Skin: No rashes, bruises or suspicious lesions. Lymph: No cervical or inguinal adenopathy. Neurologic: Grossly intact, no focal deficits, moving all 4 extremities. Psychiatric: Normal mood and affect.  Laboratory Data: Lab Results  Component Value Date   WBC 8.0 01/01/2023   HGB 12.9 01/01/2023   HCT 39.6 01/01/2023   MCV 94 01/01/2023   PLT 243 01/01/2023    Lab Results  Component Value Date   CREATININE 0.87 01/01/2023    No results found for: "PSA"  No results found for: "TESTOSTERONE"  Lab Results  Component Value Date   HGBA1C 5.6 01/01/2023    Urinalysis    Component Value Date/Time   LABSPEC <1.005 07/27/2006 1350   PHURINE 5.0 07/27/2006 1350   HGBUR negative 07/27/2006 1350   BILIRUBINUR negative 07/27/2006 1350   UROBILINOGEN negative 07/27/2006 1350   NITRITE negative 07/27/2006 1350    Pertinent Imaging: Urine reviewed.  Urine sent for culture.  Chart reviewed  Assessment & Plan: Patient appears to have mixed incontinence.  She leaks a lot with walking.  She says when she starts leaking she cannot hold it.  Role of urodynamics and cystoscopy discussed.  Call if urine culture positive.  Patient likely primarily have stress incontinence.  Having said that she does have urge incontinence and she does not void very frequently.  1. Stress incontinence of urine (Primary)  - Urinalysis, Complete   No follow-ups on file.  Devorah Fonder, MD  [facility] [address] [place], [place] [address] [phone]

## 2023-07-01 DIAGNOSIS — N3946 Mixed incontinence: Secondary | ICD-10-CM | POA: Diagnosis not present

## 2023-07-07 DIAGNOSIS — R310 Gross hematuria: Secondary | ICD-10-CM | POA: Diagnosis not present

## 2023-07-07 DIAGNOSIS — N3946 Mixed incontinence: Secondary | ICD-10-CM | POA: Diagnosis not present

## 2023-07-07 DIAGNOSIS — R350 Frequency of micturition: Secondary | ICD-10-CM | POA: Diagnosis not present

## 2023-08-23 ENCOUNTER — Other Ambulatory Visit: Admitting: Urology

## 2023-08-26 ENCOUNTER — Encounter: Payer: Self-pay | Admitting: Urology

## 2023-09-14 DIAGNOSIS — R8271 Bacteriuria: Secondary | ICD-10-CM | POA: Diagnosis not present

## 2023-09-14 DIAGNOSIS — N3000 Acute cystitis without hematuria: Secondary | ICD-10-CM | POA: Diagnosis not present

## 2023-09-23 DIAGNOSIS — R310 Gross hematuria: Secondary | ICD-10-CM | POA: Diagnosis not present

## 2024-01-24 ENCOUNTER — Other Ambulatory Visit: Payer: Self-pay | Admitting: Gerontology

## 2024-01-24 DIAGNOSIS — Z1231 Encounter for screening mammogram for malignant neoplasm of breast: Secondary | ICD-10-CM

## 2024-01-28 ENCOUNTER — Other Ambulatory Visit: Payer: Self-pay | Admitting: Gerontology

## 2024-01-28 DIAGNOSIS — Z13820 Encounter for screening for osteoporosis: Secondary | ICD-10-CM

## 2024-03-03 ENCOUNTER — Encounter

## 2024-03-09 ENCOUNTER — Other Ambulatory Visit

## 2024-03-09 ENCOUNTER — Encounter
